# Patient Record
Sex: Female | Born: 1985
Health system: Southern US, Community
[De-identification: ages and names within clinical notes are randomized; demographics above are authoritative.]

## PROBLEM LIST (undated history)

## (undated) ENCOUNTER — Inpatient Hospital Stay (HOSPITAL_COMMUNITY): Payer: Self-pay

## (undated) DIAGNOSIS — Z01419 Encounter for gynecological examination (general) (routine) without abnormal findings: Secondary | ICD-10-CM

## (undated) DIAGNOSIS — N200 Calculus of kidney: Secondary | ICD-10-CM

## (undated) DIAGNOSIS — K219 Gastro-esophageal reflux disease without esophagitis: Secondary | ICD-10-CM

## (undated) DIAGNOSIS — Z975 Presence of (intrauterine) contraceptive device: Secondary | ICD-10-CM

## (undated) DIAGNOSIS — K5909 Other constipation: Secondary | ICD-10-CM

## (undated) DIAGNOSIS — Z87442 Personal history of urinary calculi: Secondary | ICD-10-CM

## (undated) DIAGNOSIS — G4700 Insomnia, unspecified: Secondary | ICD-10-CM

## (undated) DIAGNOSIS — Z8619 Personal history of other infectious and parasitic diseases: Secondary | ICD-10-CM

## (undated) HISTORY — DX: Gastro-esophageal reflux disease without esophagitis: K21.9

## (undated) HISTORY — DX: Encounter for gynecological examination (general) (routine) without abnormal findings: Z01.419

## (undated) HISTORY — DX: Other constipation: K59.09

## (undated) HISTORY — PX: BREAST SURGERY: SHX581

## (undated) HISTORY — DX: Personal history of other infectious and parasitic diseases: Z86.19

## (undated) HISTORY — DX: Personal history of urinary calculi: Z87.442

## (undated) HISTORY — DX: Presence of (intrauterine) contraceptive device: Z97.5

## (undated) HISTORY — PX: DILATION AND CURETTAGE OF UTERUS: SHX78

## (undated) HISTORY — DX: Insomnia, unspecified: G47.00

---
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
## 2010-11-27 LAB — GC/CHLAMYDIA PROBE AMP, GENITAL: Gonorrhea: NEGATIVE

## 2010-11-27 LAB — ABO/RH

## 2010-11-27 LAB — RPR: RPR: NONREACTIVE

## 2010-11-27 LAB — RUBELLA ANTIBODY, IGM: Rubella: IMMUNE

## 2010-11-27 LAB — HEPATITIS B SURFACE ANTIGEN: Hepatitis B Surface Ag: NEGATIVE

## 2011-03-05 ENCOUNTER — Other Ambulatory Visit (HOSPITAL_COMMUNITY): Payer: Self-pay | Admitting: Obstetrics and Gynecology

## 2011-03-05 DIAGNOSIS — Z0489 Encounter for examination and observation for other specified reasons: Secondary | ICD-10-CM

## 2011-03-26 ENCOUNTER — Ambulatory Visit (HOSPITAL_COMMUNITY)
Admission: RE | Admit: 2011-03-26 | Discharge: 2011-03-26 | Disposition: A | Discharge status: Home / Self Care | Payer: Medicaid Other | Source: Ambulatory Visit | Attending: Obstetrics and Gynecology | Admitting: Obstetrics and Gynecology

## 2011-03-26 ENCOUNTER — Encounter (HOSPITAL_COMMUNITY): Payer: Self-pay

## 2011-03-26 DIAGNOSIS — Z363 Encounter for antenatal screening for malformations: Secondary | ICD-10-CM | POA: Insufficient documentation

## 2011-03-26 DIAGNOSIS — O358XX0 Maternal care for other (suspected) fetal abnormality and damage, not applicable or unspecified: Secondary | ICD-10-CM | POA: Insufficient documentation

## 2011-03-26 DIAGNOSIS — Z1389 Encounter for screening for other disorder: Secondary | ICD-10-CM | POA: Insufficient documentation

## 2011-03-26 DIAGNOSIS — Z0489 Encounter for examination and observation for other specified reasons: Secondary | ICD-10-CM

## 2011-04-20 ENCOUNTER — Encounter (HOSPITAL_COMMUNITY): Payer: Self-pay | Admitting: *Deleted

## 2011-04-20 ENCOUNTER — Inpatient Hospital Stay (HOSPITAL_COMMUNITY)
Admission: AD | Admit: 2011-04-20 | Discharge: 2011-04-20 | Disposition: A | Discharge status: Home / Self Care | Payer: Medicaid Other | Source: Ambulatory Visit | Attending: Obstetrics and Gynecology | Admitting: Obstetrics and Gynecology

## 2011-04-20 DIAGNOSIS — O479 False labor, unspecified: Secondary | ICD-10-CM

## 2011-04-20 DIAGNOSIS — O4700 False labor before 37 completed weeks of gestation, unspecified trimester: Secondary | ICD-10-CM

## 2011-04-20 LAB — WET PREP, GENITAL

## 2011-04-20 LAB — URINE MICROSCOPIC-ADD ON

## 2011-04-20 LAB — URINALYSIS, ROUTINE W REFLEX MICROSCOPIC
Bilirubin Urine: NEGATIVE
Glucose, UA: NEGATIVE mg/dL
Ketones, ur: NEGATIVE mg/dL
Leukocytes, UA: NEGATIVE
pH: 7.5 (ref 5.0–8.0)

## 2011-04-20 LAB — FETAL FIBRONECTIN: Fetal Fibronectin: POSITIVE — AB

## 2011-04-20 MED ORDER — ACETAMINOPHEN 500 MG PO TABS
1000.0000 mg | ORAL_TABLET | Freq: Once | ORAL | Status: AC
  Administered 2011-04-20: 1000 mg via ORAL
  Filled 2011-04-20: qty 2

## 2011-04-20 MED ORDER — OXYCODONE-ACETAMINOPHEN 5-325 MG PO TABS
2.0000 | ORAL_TABLET | Freq: Once | ORAL | Status: DC
  Filled 2011-04-20: qty 2

## 2011-04-20 NOTE — Progress Notes (Signed)
PT HAS HX PTL WITH FIRST BABY.    THIS IS FIRST  TIME ANY UC WITH THIS PREG.  SAYS STARTED HURTING AT 10PM- THEN AT 1030- DRANK WATER- UC  THEN Q4 MIN.  CALLED DR BOVARD- TOLD HER TO COME IN.  LAST SEX - ON WED.

## 2011-04-20 NOTE — ED Provider Notes (Signed)
History     No chief complaint on file.  HPI 25 y.o. G3P1011 at [redacted]w[redacted]d with contractions tonight about q 4 min, no bleeding or LOF, + fetal movement. Last intercourse 8 PM 12/19. H/O PTL throughout first pregnancy with term delivery.     Past Medical History  Diagnosis Date  . Preterm labor     Past Surgical History  Procedure Date  . Breast surgery     No family history on file.  History  Substance Use Topics  . Smoking status: Former Games developer  . Smokeless tobacco: Not on file  . Alcohol Use: No    Allergies: No Known Allergies  Prescriptions prior to admission  Medication Sig Dispense Refill  . pantoprazole (PROTONIX) 40 MG tablet Take 40 mg by mouth daily.        Marland Kitchen PRENATAL VITAMINS PO Take by mouth.          Review of Systems  Constitutional: Negative.   Respiratory: Negative.   Cardiovascular: Negative.   Gastrointestinal: Negative for nausea, vomiting, abdominal pain, diarrhea and constipation.  Genitourinary: Negative for dysuria, urgency, frequency, hematuria and flank pain.       Negative for vaginal bleeding, Positive contractions   Musculoskeletal: Negative.   Neurological: Negative.   Psychiatric/Behavioral: Negative.    Physical Exam   Blood pressure 123/67, pulse 90, temperature 99.4 F (37.4 C), temperature source Oral, resp. rate 18, height 5\' 5"  (1.651 m), weight 163 lb (73.936 kg), last menstrual period 09/23/2010.  Physical Exam  Nursing note and vitals reviewed. Constitutional: She is oriented to person, place, and time. She appears well-developed and well-nourished. No distress.  HENT:  Head: Normocephalic and atraumatic.  Cardiovascular: Normal rate.   Respiratory: Effort normal.  GI: Soft. Bowel sounds are normal. She exhibits no mass. There is no tenderness. There is no rebound and no guarding.  Genitourinary: There is no rash or lesion on the right labia. There is no rash or lesion on the left labia. Uterus is not tender. Enlarged:  Size c/w dates. Cervix exhibits no discharge and no friability. No tenderness or bleeding around the vagina. No vaginal discharge found.       SVE: ext os 1/int os closed/thick/high/posterior  Musculoskeletal: Normal range of motion.  Neurological: She is alert and oriented to person, place, and time.  Skin: Skin is warm and dry.  Psychiatric: She has a normal mood and affect.    MAU Course  Procedures  Results for orders placed during the hospital encounter of 04/20/11 (from the past 24 hour(s))  URINALYSIS, ROUTINE W REFLEX MICROSCOPIC     Status: Abnormal   Collection Time   04/20/11 12:05 AM      Component Value Range   Color, Urine YELLOW  YELLOW    APPearance CLEAR  CLEAR    Specific Gravity, Urine 1.020  1.005 - 1.030    pH 7.5  5.0 - 8.0    Glucose, UA NEGATIVE  NEGATIVE (mg/dL)   Hgb urine dipstick LARGE (*) NEGATIVE    Bilirubin Urine NEGATIVE  NEGATIVE    Ketones, ur NEGATIVE  NEGATIVE (mg/dL)   Protein, ur NEGATIVE  NEGATIVE (mg/dL)   Urobilinogen, UA 0.2  0.0 - 1.0 (mg/dL)   Nitrite NEGATIVE  NEGATIVE    Leukocytes, UA NEGATIVE  NEGATIVE   URINE MICROSCOPIC-ADD ON     Status: Normal   Collection Time   04/20/11 12:05 AM      Component Value Range   Squamous Epithelial / LPF  RARE  RARE    WBC, UA 0-2  <3 (WBC/hpf)   RBC / HPF 21-50  <3 (RBC/hpf)   Urine-Other MUCOUS PRESENT    FETAL FIBRONECTIN     Status: Abnormal   Collection Time   04/20/11 12:55 AM      Component Value Range   Fetal Fibronectin POSITIVE (*) NEGATIVE   WET PREP, GENITAL     Status: Abnormal   Collection Time   04/20/11 12:55 AM      Component Value Range   Yeast, Wet Prep NONE SEEN  NONE SEEN    Trich, Wet Prep NONE SEEN  NONE SEEN    Clue Cells, Wet Prep NONE SEEN  NONE SEEN    WBC, Wet Prep HPF POC MODERATE (*) NONE SEEN     No cervical change after greater than 1 hour. Contractions appear to have decreased in frequency and duration. Pt states they feel about the same. Observed for  another hour, irritability only on monitor, pt states she is feeling better and ready for d/c.   Assessment and Plan  25 y.o. G3P1011 at [redacted]w[redacted]d Threatened preterm labor - no signs of active labor D/C home, f/u as scheduled or sooner PRN  FRAZIER,NATALIE 04/20/2011, 2:17 AM

## 2011-05-01 ENCOUNTER — Inpatient Hospital Stay (HOSPITAL_COMMUNITY): Payer: Medicaid Other

## 2011-05-01 ENCOUNTER — Encounter (HOSPITAL_COMMUNITY): Payer: Self-pay | Admitting: *Deleted

## 2011-05-01 ENCOUNTER — Observation Stay (HOSPITAL_COMMUNITY)
Admission: AD | Admit: 2011-05-01 | Discharge: 2011-05-02 | Disposition: A | Discharge status: Home / Self Care | Payer: Medicaid Other | Source: Ambulatory Visit | Attending: Obstetrics and Gynecology | Admitting: Obstetrics and Gynecology

## 2011-05-01 DIAGNOSIS — R319 Hematuria, unspecified: Secondary | ICD-10-CM | POA: Insufficient documentation

## 2011-05-01 DIAGNOSIS — N200 Calculus of kidney: Secondary | ICD-10-CM

## 2011-05-01 DIAGNOSIS — O99891 Other specified diseases and conditions complicating pregnancy: Principal | ICD-10-CM | POA: Insufficient documentation

## 2011-05-01 DIAGNOSIS — R109 Unspecified abdominal pain: Secondary | ICD-10-CM | POA: Insufficient documentation

## 2011-05-01 DIAGNOSIS — M549 Dorsalgia, unspecified: Secondary | ICD-10-CM | POA: Insufficient documentation

## 2011-05-01 HISTORY — DX: Calculus of kidney: N20.0

## 2011-05-01 LAB — URINALYSIS, ROUTINE W REFLEX MICROSCOPIC
Bilirubin Urine: NEGATIVE
Nitrite: NEGATIVE
Specific Gravity, Urine: 1.015 (ref 1.005–1.030)
Urobilinogen, UA: 0.2 mg/dL (ref 0.0–1.0)

## 2011-05-01 LAB — URINE MICROSCOPIC-ADD ON

## 2011-05-01 MED ORDER — DOCUSATE SODIUM 100 MG PO CAPS
100.0000 mg | ORAL_CAPSULE | Freq: Every day | ORAL | Status: DC
  Administered 2011-05-01 – 2011-05-02 (×2): 100 mg via ORAL
  Filled 2011-05-01 (×2): qty 1

## 2011-05-01 MED ORDER — ONDANSETRON HCL 4 MG/2ML IJ SOLN: 4.0000 mg | Freq: Once | INTRAMUSCULAR | Status: DC

## 2011-05-01 MED ORDER — HYDROCODONE-ACETAMINOPHEN 5-325 MG PO TABS
1.0000 | ORAL_TABLET | Freq: Once | ORAL | Status: AC
  Administered 2011-05-01: 1 via ORAL
  Filled 2011-05-01: qty 1

## 2011-05-01 MED ORDER — HYDROMORPHONE HCL PF 1 MG/ML IJ SOLN
1.0000 mg | Freq: Once | INTRAMUSCULAR | Status: DC
  Filled 2011-05-01: qty 1

## 2011-05-01 MED ORDER — CALCIUM CARBONATE ANTACID 500 MG PO CHEW: 2.0000 | CHEWABLE_TABLET | ORAL | Status: DC | PRN

## 2011-05-01 MED ORDER — PANTOPRAZOLE SODIUM 40 MG PO TBEC
40.0000 mg | DELAYED_RELEASE_TABLET | Freq: Every day | ORAL | Status: DC
  Administered 2011-05-01: 40 mg via ORAL
  Filled 2011-05-01 (×2): qty 1

## 2011-05-01 MED ORDER — PRENATAL MULTIVITAMIN CH: 1.0000 | ORAL_TABLET | Freq: Every day | ORAL | Status: DC

## 2011-05-01 MED ORDER — HYDROMORPHONE HCL PF 1 MG/ML IJ SOLN
1.0000 mg | Freq: Once | INTRAMUSCULAR | Status: AC
  Administered 2011-05-01: 1 mg via INTRAVENOUS

## 2011-05-01 MED ORDER — ACETAMINOPHEN 325 MG PO TABS: 650.0000 mg | ORAL_TABLET | ORAL | Status: DC | PRN

## 2011-05-01 MED ORDER — LACTATED RINGERS IV SOLN
INTRAVENOUS | Status: DC
  Administered 2011-05-01: 125 mL/h via INTRAVENOUS
  Administered 2011-05-02 (×2): via INTRAVENOUS

## 2011-05-01 MED ORDER — ZOLPIDEM TARTRATE 10 MG PO TABS: 10.0000 mg | ORAL_TABLET | Freq: Every evening | ORAL | Status: DC | PRN

## 2011-05-01 MED ORDER — PRENATAL MULTIVITAMIN CH
1.0000 | ORAL_TABLET | Freq: Every day | ORAL | Status: DC
  Administered 2011-05-01 – 2011-05-02 (×2): 1 via ORAL
  Filled 2011-05-01 (×2): qty 1

## 2011-05-01 MED ORDER — OXYCODONE-ACETAMINOPHEN 5-325 MG PO TABS
1.0000 | ORAL_TABLET | Freq: Four times a day (QID) | ORAL | Status: DC | PRN
  Administered 2011-05-01 – 2011-05-02 (×2): 2 via ORAL
  Filled 2011-05-01 (×2): qty 2

## 2011-05-01 MED ORDER — ONDANSETRON HCL 4 MG/2ML IJ SOLN
4.0000 mg | Freq: Once | INTRAMUSCULAR | Status: AC
  Administered 2011-05-01: 4 mg via INTRAVENOUS
  Filled 2011-05-01: qty 2

## 2011-05-01 MED ORDER — SODIUM CHLORIDE 0.9 % IV SOLN
INTRAVENOUS | Status: DC
  Administered 2011-05-01: 14:00:00 via INTRAVENOUS

## 2011-05-01 MED ORDER — OXYCODONE HCL 5 MG PO TABS
10.0000 mg | ORAL_TABLET | Freq: Once | ORAL | Status: AC
  Administered 2011-05-01: 10 mg via ORAL
  Filled 2011-05-01: qty 2

## 2011-05-01 NOTE — ED Provider Notes (Signed)
History     CSN: [id number]  Arrival date & time 05/01/11  1103   None     Chief Complaint  Patient presents with  . Back Pain  . Abdominal Cramping    HPI Barbara Wolfe is a 26 y.o. female @ [redacted]w[redacted]d gestation who presents to MAU for right flank pain that started this morning around 9:30. Now has nausea and vomiting. Felt like having contractions. Hx of preterm contractions. Also states feeling baby move but not as much as usual. The history was provided by the patient.  Past Medical History  Diagnosis Date  . Preterm labor     Past Surgical History  Procedure Date  . Breast surgery     No family history on file.  History  Substance Use Topics  . Smoking status: Former Games developer  . Smokeless tobacco: Not on file  . Alcohol Use: No    OB History    Grav Para Term Preterm Abortions TAB SAB Ect Mult Living   3 1 1  0 1 1 0 0 0 1      Review of Systems  Constitutional: Positive for chills. Negative for fever.  HENT: Negative.   Eyes: Negative.   Respiratory: Negative.   Cardiovascular: Negative.   Gastrointestinal: Positive for nausea, vomiting and abdominal pain.  Genitourinary: Negative for dysuria, vaginal bleeding and vaginal discharge.  Neurological: Negative for dizziness and headaches.  Psychiatric/Behavioral: Negative for confusion and agitation.    Allergies  Review of patient's allergies indicates no known allergies.  Home Medications  No current outpatient prescriptions on file.  BP 105/59  Pulse 93  Temp(Src) 98.8 F (37.1 C) (Oral)  Resp 18  Ht 5\' 5"  (1.651 m)  Wt 166 lb 3.2 oz (75.388 kg)  BMI 27.66 kg/m2  LMP 09/23/2010  Physical Exam  Nursing note and vitals reviewed. Constitutional: She is oriented to person, place, and time. She appears well-developed and well-nourished. No distress.       Uncomfortable appearing   HENT:  Head: Normocephalic.  Eyes: EOM are normal.  Neck: Neck supple.  Cardiovascular: Normal rate.     Pulmonary/Chest: Effort normal.  Abdominal: Soft. There is no tenderness.  Genitourinary:       Right flank pain on palpation  Musculoskeletal: Normal range of motion.  Neurological: She is alert and oriented to person, place, and time. No cranial nerve deficit.  Skin: Skin is warm and dry.  Psychiatric: She has a normal mood and affect. Her behavior is normal. Judgment and thought content normal.   EFM: Reactive tracing and no contractions   Results for orders placed during the hospital encounter of 05/01/11 (from the past 24 hour(s))  URINALYSIS, ROUTINE W REFLEX MICROSCOPIC     Status: Abnormal   Collection Time   05/01/11 11:11 AM      Component Value Range   Color, Urine YELLOW  YELLOW    APPearance CLOUDY (*) CLEAR    Specific Gravity, Urine 1.015  1.005 - 1.030    pH 8.0  5.0 - 8.0    Glucose, UA NEGATIVE  NEGATIVE (mg/dL)   Hgb urine dipstick MODERATE (*) NEGATIVE    Bilirubin Urine NEGATIVE  NEGATIVE    Ketones, ur NEGATIVE  NEGATIVE (mg/dL)   Protein, ur NEGATIVE  NEGATIVE (mg/dL)   Urobilinogen, UA 0.2  0.0 - 1.0 (mg/dL)   Nitrite NEGATIVE  NEGATIVE    Leukocytes, UA TRACE (*) NEGATIVE   URINE MICROSCOPIC-ADD ON     Status: Abnormal  Collection Time   05/01/11 11:11 AM      Component Value Range   Squamous Epithelial / LPF MANY (*) RARE    WBC, UA 3-6  <3 (WBC/hpf)   RBC / HPF 11-20  <3 (RBC/hpf)   Urine-Other AMORPHOUS URATES/PHOSPHATES    US Renal  05/01/2011  *RADIOLOGY REPORT*  Clinical Data: Back pain.  [redacted] weeks pregnant.  RENAL/URINARY TRACT ULTRASOUND COMPLETE  Comparison:  None.  Findings:  Right Kidney:  There is moderate dilatation of the right renal pelvis, calyces and proximal ureter.  No calculus is identified. Renal length is 13.1 cm.  There are no focal cortical abnormalities.  Left Kidney:  No hydronephrosis.  Well-preserved cortex.  Normal size and parenchymal echotexture without focal abnormalities. Renal length 12.6 cm.  Bladder:  Nearly empty and  suboptimally visualized.  Gravid uterus was not specifically evaluated.  IMPRESSION: Moderate right-sided hydronephrosis is greater than typically seen with pregnancy.  Correlate clinically.  If there is clinical concern of a ureteral calculus, further evaluation with CT or MRI should be considered.  Normal-appearing left kidney.  Original Report Authenticated By: Gerrianne Scale, M.D.   ED Course: Dr. Ellyn Hack notified of patient and we will treat pain and order renal  Ultrasound.  Procedures  MDM: Dr. Ellyn Hack notified of ultrasound results and will admit patient for 24 hour observation and pain management.    Assessment: Hematuria, probably kidney stone  Plan:  Observation   IV hydration   Pain management   Management of nausea and vomiting.        [place], [place] 05/01/11 2006

## 2011-05-01 NOTE — Progress Notes (Signed)
Patient states she had sudden onset of right flank pain this am, has been having contractions since 0930. No leaking or bleeding and reports fetal movement, not as much as usual.

## 2011-05-01 NOTE — L&D Delivery Note (Signed)
Delivery Note At 10:32 PM a viable and healthy female was delivered via Vaginal, Spontaneous Delivery (Presentation: Middle Occiput Anterior).  APGAR: 8, 9; weight 7 lbs 11 oz .   Placenta status: Intact, Spontaneous.  Cord: 3 vessels with the following complications: None.  Nuchal cord x 3 reduced.  Anesthesia: Epidural  Episiotomy: None Lacerations: Bilateral Labial Suture Repair: none Est. Blood Loss (mL): 350  Mom to postpartum.  Baby to nursery-stable.  Barbara Wolfe D 06/19/2011, 10:53 PM

## 2011-05-02 ENCOUNTER — Other Ambulatory Visit: Payer: Self-pay | Admitting: Obstetrics and Gynecology

## 2011-05-02 MED ORDER — OXYCODONE-ACETAMINOPHEN 5-325 MG PO TABS: 1.0000 | ORAL_TABLET | Freq: Four times a day (QID) | ORAL | Status: AC | PRN

## 2011-05-02 NOTE — Discharge Summary (Signed)
Obstetric Discharge Summary Reason for Admission: kidney stone, pain management Prenatal Procedures: NST and pain control Intrapartum Procedures: N/A Postpartum Procedures: N/A Complications-Operative and Postpartum: passed stone, sent to pathology No results found for this basename: hgb, hct    Discharge Diagnoses: IUP @31 +, kidney stone  Discharge Information: Date: 05/02/2011 Activity: unrestricted Diet: routine Medications: PNV and Percocet Condition: stable Instructions: refer to practice specific booklet and follow up with urology as needed.  Dr. Annabell Howells [facility] Discharge to: home Follow-up Information    Follow up with Bing Plume, MD. (as scheduled)    Contact information:   [facility] [address] [place], [address] [place] [address] [phone]            Wolfe,Barbara Jahn 05/02/2011, 9:03 AMob

## 2011-05-02 NOTE — Progress Notes (Signed)
Isella Slatten is a 26 y.o. G3P1011 at [redacted]w[redacted]d  admitted for kidney stone/pain management  Subjective: No c/o's.  Passed 2 stones overnight.  Pain improved.  +FM, no LOF, no VB, occ ctx  Objective: BP 91/40  Pulse 93  Temp(Src) 98.7 F (37.1 C) (Oral)  Resp 18  Ht 5\' 5"  (1.651 m)  Wt 75.388 kg (166 lb 3.2 oz)  BMI 27.66 kg/m2  LMP 09/23/2010     gen NAD Abd FNT  ZOX:[id number] NSTt UC:   infrequent  Labs: No results found for this basename: WBC, HGB, HCT, MCV, PLT    Assessment / Plan: 25yo G3P1011 at 31+ with likely kidney stone, passed overnight (x2) Pain improved, d/c home f/u in office as scheduled Follow up with urology as needed  BOVARD,Kendell Gammon 05/02/2011, 8:56 AM

## 2011-05-04 ENCOUNTER — Telehealth (HOSPITAL_COMMUNITY): Payer: Self-pay | Admitting: General Practice

## 2011-05-06 LAB — STONE ANALYSIS: Stone Weight KSTONE: 0.001 g

## 2011-05-23 NOTE — Progress Notes (Signed)
UR chart review completed.  

## 2011-06-03 ENCOUNTER — Inpatient Hospital Stay (HOSPITAL_COMMUNITY)
Admission: AD | Admit: 2011-06-03 | Discharge: 2011-06-03 | Disposition: A | Discharge status: Home / Self Care | Payer: Medicaid Other | Source: Ambulatory Visit | Attending: Obstetrics and Gynecology | Admitting: Obstetrics and Gynecology

## 2011-06-03 ENCOUNTER — Encounter (HOSPITAL_COMMUNITY): Payer: Self-pay | Admitting: *Deleted

## 2011-06-03 DIAGNOSIS — O4700 False labor before 37 completed weeks of gestation, unspecified trimester: Secondary | ICD-10-CM | POA: Insufficient documentation

## 2011-06-03 HISTORY — DX: Calculus of kidney: N20.0

## 2011-06-03 NOTE — Progress Notes (Signed)
States has had ctxs for about a wk. Usually get less intense and able to sleep some. Tonight  ctxs kept coming and alittle stronger.

## 2011-06-03 NOTE — Progress Notes (Signed)
Dr Jackelyn Knife notified of patient and her complaints, tracing, ctx pattern, sve result. Order to have patient ambulate and recheck cervix in an hour. If no change, discharge patient home with labor precautions.

## 2011-06-03 NOTE — Progress Notes (Signed)
Pt states, " I started having contractions at 9:30 pm and they became regular at 10:30pm. They are now every five minutes and are getting stronger."

## 2011-06-04 LAB — STREP B DNA PROBE: GBS: POSITIVE

## 2011-06-18 ENCOUNTER — Telehealth (HOSPITAL_COMMUNITY): Payer: Self-pay | Admitting: *Deleted

## 2011-06-18 ENCOUNTER — Encounter (HOSPITAL_COMMUNITY): Payer: Self-pay | Admitting: *Deleted

## 2011-06-18 NOTE — Telephone Encounter (Signed)
Preadmission screen  

## 2011-06-19 ENCOUNTER — Encounter (HOSPITAL_COMMUNITY): Payer: Self-pay | Admitting: Anesthesiology

## 2011-06-19 ENCOUNTER — Inpatient Hospital Stay (HOSPITAL_COMMUNITY): Payer: Medicaid Other | Admitting: Anesthesiology

## 2011-06-19 ENCOUNTER — Inpatient Hospital Stay (HOSPITAL_COMMUNITY)
Admission: AD | Admit: 2011-06-19 | Discharge: 2011-06-21 | DRG: 775 | Disposition: A | Discharge status: Home Health | Payer: Medicaid Other | Source: Ambulatory Visit | Attending: Obstetrics and Gynecology | Admitting: Obstetrics and Gynecology

## 2011-06-19 ENCOUNTER — Encounter (HOSPITAL_COMMUNITY): Payer: Self-pay | Admitting: *Deleted

## 2011-06-19 DIAGNOSIS — IMO0001 Reserved for inherently not codable concepts without codable children: Secondary | ICD-10-CM

## 2011-06-19 DIAGNOSIS — O99892 Other specified diseases and conditions complicating childbirth: Secondary | ICD-10-CM | POA: Diagnosis present

## 2011-06-19 DIAGNOSIS — Z22330 Carrier of Group B streptococcus: Secondary | ICD-10-CM

## 2011-06-19 DIAGNOSIS — N200 Calculus of kidney: Secondary | ICD-10-CM | POA: Diagnosis present

## 2011-06-19 LAB — CBC
HCT: 39 % (ref 36.0–46.0)
Hemoglobin: 13.1 g/dL (ref 12.0–15.0)
MCHC: 33.6 g/dL (ref 30.0–36.0)

## 2011-06-19 MED ORDER — SODIUM CHLORIDE 0.9 % IV SOLN
2.0000 g | Freq: Once | INTRAVENOUS | Status: AC
  Administered 2011-06-19: 2 g via INTRAVENOUS
  Filled 2011-06-19: qty 2000

## 2011-06-19 MED ORDER — OXYCODONE-ACETAMINOPHEN 5-325 MG PO TABS: 1.0000 | ORAL_TABLET | ORAL | Status: DC | PRN

## 2011-06-19 MED ORDER — FENTANYL 2.5 MCG/ML BUPIVACAINE 1/10 % EPIDURAL INFUSION (WH - ANES)
14.0000 mL/h | INTRAMUSCULAR | Status: DC
Start: 2011-06-19 — End: 2011-06-20
  Administered 2011-06-19: 14 mL/h via EPIDURAL
  Filled 2011-06-19: qty 60

## 2011-06-19 MED ORDER — LACTATED RINGERS IV SOLN
INTRAVENOUS | Status: DC
  Administered 2011-06-19: 20:00:00 via INTRAVENOUS
  Administered 2011-06-19: 125 mL/h via INTRAVENOUS
  Administered 2011-06-19: 21:00:00 via INTRAVENOUS

## 2011-06-19 MED ORDER — PENICILLIN G POTASSIUM 5000000 UNITS IJ SOLR
5.0000 10*6.[IU] | Freq: Once | INTRAMUSCULAR | Status: DC
  Administered 2011-06-19: 5 10*6.[IU] via INTRAVENOUS
  Filled 2011-06-19: qty 5

## 2011-06-19 MED ORDER — EPHEDRINE 5 MG/ML INJ
10.0000 mg | INTRAVENOUS | Status: DC | PRN
  Filled 2011-06-19: qty 4

## 2011-06-19 MED ORDER — LIDOCAINE HCL (PF) 1 % IJ SOLN
30.0000 mL | INTRAMUSCULAR | Status: DC | PRN
  Filled 2011-06-19: qty 30

## 2011-06-19 MED ORDER — LACTATED RINGERS IV SOLN: 500.0000 mL | Freq: Once | INTRAVENOUS | Status: DC

## 2011-06-19 MED ORDER — OXYTOCIN 20 UNITS IN LACTATED RINGERS INFUSION - SIMPLE: 125.0000 mL/h | Freq: Once | INTRAVENOUS | Status: DC

## 2011-06-19 MED ORDER — LIDOCAINE HCL (PF) 1 % IJ SOLN
INTRAMUSCULAR | Status: DC | PRN
  Administered 2011-06-19 (×2): 5 mL

## 2011-06-19 MED ORDER — IBUPROFEN 600 MG PO TABS: 600.0000 mg | ORAL_TABLET | Freq: Four times a day (QID) | ORAL | Status: DC | PRN

## 2011-06-19 MED ORDER — DIPHENHYDRAMINE HCL 50 MG/ML IJ SOLN: 12.5000 mg | INTRAMUSCULAR | Status: DC | PRN

## 2011-06-19 MED ORDER — LACTATED RINGERS IV SOLN: 500.0000 mL | INTRAVENOUS | Status: DC | PRN

## 2011-06-19 MED ORDER — OXYTOCIN BOLUS FROM INFUSION
500.0000 mL | Freq: Once | INTRAVENOUS | Status: DC
  Filled 2011-06-19: qty 1000
  Filled 2011-06-19: qty 500

## 2011-06-19 MED ORDER — PHENYLEPHRINE 40 MCG/ML (10ML) SYRINGE FOR IV PUSH (FOR BLOOD PRESSURE SUPPORT): 80.0000 ug | PREFILLED_SYRINGE | INTRAVENOUS | Status: DC | PRN

## 2011-06-19 MED ORDER — ACETAMINOPHEN 325 MG PO TABS: 650.0000 mg | ORAL_TABLET | ORAL | Status: DC | PRN

## 2011-06-19 MED ORDER — ONDANSETRON HCL 4 MG/2ML IJ SOLN: 4.0000 mg | Freq: Four times a day (QID) | INTRAMUSCULAR | Status: DC | PRN

## 2011-06-19 MED ORDER — PHENYLEPHRINE 40 MCG/ML (10ML) SYRINGE FOR IV PUSH (FOR BLOOD PRESSURE SUPPORT)
80.0000 ug | PREFILLED_SYRINGE | INTRAVENOUS | Status: DC | PRN
  Filled 2011-06-19: qty 5

## 2011-06-19 MED ORDER — CITRIC ACID-SODIUM CITRATE 334-500 MG/5ML PO SOLN: 30.0000 mL | ORAL | Status: DC | PRN

## 2011-06-19 MED ORDER — PENICILLIN G POTASSIUM 5000000 UNITS IJ SOLR
2.5000 10*6.[IU] | INTRAVENOUS | Status: DC
  Filled 2011-06-19 (×2): qty 2.5

## 2011-06-19 MED ORDER — EPHEDRINE 5 MG/ML INJ: 10.0000 mg | INTRAVENOUS | Status: DC | PRN

## 2011-06-19 NOTE — Anesthesia Preprocedure Evaluation (Signed)
Anesthesia Evaluation  Patient identified by MRN, date of birth, ID band Patient awake    Reviewed: Allergy & Precautions, H&P , Patient's Chart, lab work & pertinent test results  Airway Mallampati: II TM Distance: >3 FB Neck ROM: full    Dental No notable dental hx.    Pulmonary neg pulmonary ROS,  clear to auscultation  Pulmonary exam normal       Cardiovascular neg cardio ROS regular Normal    Neuro/Psych Negative Neurological ROS  Negative Psych ROS   GI/Hepatic negative GI ROS, Neg liver ROS, GERD-  ,  Endo/Other  Negative Endocrine ROS  Renal/GU negative Renal ROS     Musculoskeletal   Abdominal   Peds  Hematology negative hematology ROS (+)   Anesthesia Other Findings   Reproductive/Obstetrics (+) Pregnancy                           Anesthesia Physical Anesthesia Plan  ASA: II  Anesthesia Plan: Epidural   Post-op Pain Management:    Induction:   Airway Management Planned:   Additional Equipment:   Intra-op Plan:   Post-operative Plan:   Informed Consent: I have reviewed the patients History and Physical, chart, labs and discussed the procedure including the risks, benefits and alternatives for the proposed anesthesia with the patient or authorized representative who has indicated his/her understanding and acceptance.     Plan Discussed with:   Anesthesia Plan Comments:         Anesthesia Quick Evaluation  

## 2011-06-19 NOTE — Progress Notes (Signed)
Family member came out to states pt's water had broken.  Clear fluid noted.  Called BS charge to request to bring pt as she is feeling more uncomfortable.  Ok to bring pt.

## 2011-06-19 NOTE — Progress Notes (Signed)
Dr. Jackelyn Knife notified of pt presenting for labor check. Notified of VE and ctx pattern with GBS + status.  MD will place orders.

## 2011-06-19 NOTE — Progress Notes (Signed)
Pt presents with contractions. Reports leaking fluid since 1730.

## 2011-06-19 NOTE — H&P (Signed)
Lana Flaim is a 26 y.o. female, G3 P1011, EGA 38+ weeks presenting for evaluation of regular ctx.  In MAU, reg ctx, VE 5 cm.  On arrival to L&D had SROM with clear fluid, just received epidural.  Prenatal care complicated by kidney stone and recent + chlamydia.  See prenatal records for complete history.  Maternal Medical History:  Reason for admission: Reason for admission: contractions.  Contractions: Frequency: regular.   Perceived severity is strong.    Fetal activity: Perceived fetal activity is normal.      OB History    Grav Para Term Preterm Abortions TAB SAB Ect Mult Living   3 1 1  0 1 1 0 0 0 1    SVD at 39 weeks, 7 lbs 11 oz  Past Medical History  Diagnosis Date  . Preterm labor   . Kidney stone 05/01/2011  . GERD (gastroesophageal reflux disease)    Past Surgical History  Procedure Date  . Dilation and curettage of uterus   . Breast surgery     augmentation   Family History: family history includes Cancer in her maternal grandfather.  There is no history of Anesthesia problems, and Hypotension, and Malignant hyperthermia, and Pseudochol deficiency, . Social History:  reports that she has never smoked. She has never used smokeless tobacco. She reports that she does not drink alcohol or use illicit drugs.  Review of Systems  Respiratory: Negative.   Cardiovascular: Negative.     Dilation: 8.5 Effacement (%): 90 Station: 0 Exam by:: Dr. Jackelyn Knife Blood pressure 136/76, pulse 78, temperature 99 F (37.2 C), resp. rate 20, height 5\' 6"  (1.676 m), weight 80.287 kg (177 lb), last menstrual period 09/23/2010, SpO2 100.00%. Maternal Exam:  Uterine Assessment: Contraction strength is firm.  Contraction frequency is regular.   Abdomen: Patient reports no abdominal tenderness. Estimated fetal weight is 7 1/2 lbs.   Fetal presentation: vertex  Introitus: Normal vulva. Normal vagina.  Amniotic fluid character: clear.  Pelvis: adequate for delivery.   Cervix: Cervix  evaluated by digital exam.     Fetal Exam Fetal Monitor Review: Mode: ultrasound.   Baseline rate: 130s.  Variability: minimal (<5 bpm).   Pattern: accelerations present and no decelerations.    Fetal State Assessment: Category I - tracings are normal.     Physical Exam  Constitutional: She appears well-developed and well-nourished.  Cardiovascular: Normal rate, regular rhythm and normal heart sounds.   No murmur heard. Respiratory: Effort normal and breath sounds normal. No respiratory distress. She has no wheezes.  GI: Soft.       Gravid     Prenatal labs: ABO, Rh: B/Positive/-- (07/30 0000) Antibody: Negative (07/30 0000) Rubella: Immune (07/30 0000) RPR: Nonreactive (07/30 0000)  HBsAg: Negative (07/30 0000)  HIV: Non-reactive (07/30 0000)  GBS: Positive (02/04 0000)   Assessment/Plan: IUP at 38+ weeks in active labor with SROM.  Has epidural, will monitor progress.  Had started PCN for +GBS, but switched to Ampicillin due to rapid progress.     Jceon Alverio D 06/19/2011, 9:30 PM

## 2011-06-19 NOTE — Progress Notes (Signed)
Birthing suite charge RN will call back when room available.

## 2011-06-19 NOTE — Progress Notes (Signed)
Pt may go to room 164 in twenty minutes.

## 2011-06-19 NOTE — Anesthesia Procedure Notes (Signed)
Epidural Patient location during procedure: OB Start time: 06/19/2011 9:12 PM  Staffing Anesthesiologist: Brayton Caves R Performed by: anesthesiologist   Preanesthetic Checklist Completed: patient identified, site marked, surgical consent, pre-op evaluation, timeout performed, IV checked, risks and benefits discussed and monitors and equipment checked  Epidural Patient position: sitting Prep: site prepped and draped and DuraPrep Patient monitoring: continuous pulse ox and blood pressure Approach: midline Injection technique: LOR air and LOR saline  Needle:  Needle type: Tuohy  Needle gauge: 17 G Needle length: 9 cm Needle insertion depth: 5 cm cm Catheter type: closed end flexible Catheter size: 19 Gauge Catheter at skin depth: 10 cm Test dose: negative  Assessment Events: blood not aspirated, injection not painful, no injection resistance, negative IV test and no paresthesia  Additional Notes Patient identified.  Risk benefits discussed including failed block, incomplete pain control, headache, nerve damage, paralysis, blood pressure changes, nausea, vomiting, reactions to medication both toxic or allergic, and postpartum back pain.  Patient expressed understanding and wished to proceed.  All questions were answered.  Sterile technique used throughout procedure and epidural site dressed with sterile barrier dressing. No paresthesia or other complications noted.The patient did not experience any signs of intravascular injection such as tinnitus or metallic taste in mouth nor signs of intrathecal spread such as rapid motor block. Please see nursing notes for vital signs.

## 2011-06-20 MED ORDER — DIBUCAINE 1 % RE OINT: 1.0000 "application " | TOPICAL_OINTMENT | RECTAL | Status: DC | PRN

## 2011-06-20 MED ORDER — LANOLIN HYDROUS EX OINT: TOPICAL_OINTMENT | CUTANEOUS | Status: DC | PRN

## 2011-06-20 MED ORDER — MEASLES, MUMPS & RUBELLA VAC ~~LOC~~ INJ
0.5000 mL | INJECTION | Freq: Once | SUBCUTANEOUS | Status: DC
  Filled 2011-06-20: qty 0.5

## 2011-06-20 MED ORDER — METHYLERGONOVINE MALEATE 0.2 MG/ML IJ SOLN: 0.2000 mg | INTRAMUSCULAR | Status: DC | PRN

## 2011-06-20 MED ORDER — TETANUS-DIPHTH-ACELL PERTUSSIS 5-2.5-18.5 LF-MCG/0.5 IM SUSP: 0.5000 mL | Freq: Once | INTRAMUSCULAR | Status: DC

## 2011-06-20 MED ORDER — BENZOCAINE-MENTHOL 20-0.5 % EX AERO: 1.0000 "application " | INHALATION_SPRAY | CUTANEOUS | Status: DC | PRN

## 2011-06-20 MED ORDER — IBUPROFEN 600 MG PO TABS
600.0000 mg | ORAL_TABLET | Freq: Four times a day (QID) | ORAL | Status: DC
  Administered 2011-06-20 – 2011-06-21 (×5): 600 mg via ORAL
  Filled 2011-06-20 (×5): qty 1

## 2011-06-20 MED ORDER — SIMETHICONE 80 MG PO CHEW: 80.0000 mg | CHEWABLE_TABLET | ORAL | Status: DC | PRN

## 2011-06-20 MED ORDER — OXYTOCIN 20 UNITS IN LACTATED RINGERS INFUSION - SIMPLE: 125.0000 mL/h | INTRAVENOUS | Status: DC | PRN

## 2011-06-20 MED ORDER — ONDANSETRON HCL 4 MG/2ML IJ SOLN: 4.0000 mg | INTRAMUSCULAR | Status: DC | PRN

## 2011-06-20 MED ORDER — DIPHENHYDRAMINE HCL 25 MG PO CAPS: 25.0000 mg | ORAL_CAPSULE | Freq: Four times a day (QID) | ORAL | Status: DC | PRN

## 2011-06-20 MED ORDER — SENNOSIDES-DOCUSATE SODIUM 8.6-50 MG PO TABS
2.0000 | ORAL_TABLET | Freq: Every day | ORAL | Status: DC
  Administered 2011-06-20: 2 via ORAL

## 2011-06-20 MED ORDER — ONDANSETRON HCL 4 MG PO TABS: 4.0000 mg | ORAL_TABLET | ORAL | Status: DC | PRN

## 2011-06-20 MED ORDER — PRENATAL MULTIVITAMIN CH
1.0000 | ORAL_TABLET | Freq: Every day | ORAL | Status: DC
  Administered 2011-06-20: 1 via ORAL
  Filled 2011-06-20: qty 1

## 2011-06-20 MED ORDER — METHYLERGONOVINE MALEATE 0.2 MG PO TABS: 0.2000 mg | ORAL_TABLET | ORAL | Status: DC | PRN

## 2011-06-20 MED ORDER — ZOLPIDEM TARTRATE 5 MG PO TABS: 5.0000 mg | ORAL_TABLET | Freq: Every evening | ORAL | Status: DC | PRN

## 2011-06-20 MED ORDER — WITCH HAZEL-GLYCERIN EX PADS: 1.0000 "application " | MEDICATED_PAD | CUTANEOUS | Status: DC | PRN

## 2011-06-20 MED ORDER — MAGNESIUM HYDROXIDE 400 MG/5ML PO SUSP: 30.0000 mL | ORAL | Status: DC | PRN

## 2011-06-20 MED ORDER — OXYCODONE-ACETAMINOPHEN 5-325 MG PO TABS
1.0000 | ORAL_TABLET | ORAL | Status: DC | PRN
  Administered 2011-06-20 (×3): 2 via ORAL
  Administered 2011-06-21: 1 via ORAL
  Filled 2011-06-20: qty 1
  Filled 2011-06-20 (×3): qty 2

## 2011-06-20 NOTE — Anesthesia Postprocedure Evaluation (Signed)
  Anesthesia Post-op Note  Patient: Barbara Wolfe  Procedure(s) Performed: * No procedures listed *  Patient Location: Mother/Baby  Anesthesia Type: Epidural  Level of Consciousness: awake, alert  and oriented  Airway and Oxygen Therapy: Patient Spontanous Breathing  Post-op Pain: none  Post-op Assessment: Patient's Cardiovascular Status Stable and Respiratory Function Stable  Post-op Vital Signs: stable  Complications: No apparent anesthesia complications

## 2011-06-20 NOTE — Progress Notes (Signed)
UR chart review completed.  

## 2011-06-20 NOTE — Progress Notes (Signed)
PPD #1 No problems Afeb, VSS Fundus firm, NT at U-1 Continue routine postpartum care 

## 2011-06-21 MED ORDER — OXYCODONE-ACETAMINOPHEN 5-325 MG PO TABS: 1.0000 | ORAL_TABLET | ORAL | Status: AC | PRN

## 2011-06-21 NOTE — Discharge Summary (Signed)
Obstetric Discharge Summary Reason for Admission: onset of labor Prenatal Procedures: none Intrapartum Procedures: spontaneous vaginal delivery Postpartum Procedures: none Complications-Operative and Postpartum: bilateral labial lacerations Hemoglobin  Date Value Range Status  06/19/2011 13.1  12.0-15.0 (g/dL) Final     HCT  Date Value Range Status  06/19/2011 39.0  36.0-46.0 (%) Final    Discharge Diagnoses: Term Pregnancy-delivered  Discharge Information: Date: 06/21/2011 Activity: pelvic rest Diet: routine Medications: Ibuprofen and Percocet Condition: stable Instructions: refer to practice specific booklet Discharge to: home Follow-up Information    Follow up with Tim Corriher D, MD. (call the office for an appointment in 6 wks)          Newborn Data: Live born female  Birth Weight: 7 lb 11 oz (3487 g) APGAR: 8, 9  Home with mother.  Aune Adami D 06/21/2011, 10:06 AM

## 2011-06-21 NOTE — Progress Notes (Signed)
PPD # 2 Afebrile, VSS Ambulating well  Nursing well Scant lochia Voiding without difficulty , no BM yet F/u in office in 6 wks

## 2011-06-21 NOTE — Discharge Instructions (Signed)
As per discharge pamphlet °

## 2011-06-26 ENCOUNTER — Inpatient Hospital Stay (HOSPITAL_COMMUNITY): Admission: RE | Admit: 2011-06-26 | Payer: Medicaid Other | Source: Ambulatory Visit

## 2011-06-29 ENCOUNTER — Inpatient Hospital Stay (HOSPITAL_COMMUNITY): Admission: RE | Admit: 2011-06-29 | Payer: Medicaid Other | Source: Ambulatory Visit

## 2012-02-19 ENCOUNTER — Ambulatory Visit (INDEPENDENT_AMBULATORY_CARE_PROVIDER_SITE_OTHER): Payer: 59 | Admitting: Medical

## 2012-02-19 ENCOUNTER — Ambulatory Visit: Payer: Self-pay | Admitting: Medical

## 2012-02-19 ENCOUNTER — Encounter: Payer: Self-pay | Admitting: Medical

## 2012-02-19 VITALS — BP 110/80 | HR 92 | Temp 98.4°F | Resp 18 | Wt 128.0 lb

## 2012-02-19 DIAGNOSIS — G4700 Insomnia, unspecified: Secondary | ICD-10-CM

## 2012-02-19 DIAGNOSIS — H6690 Otitis media, unspecified, unspecified ear: Secondary | ICD-10-CM

## 2012-02-19 DIAGNOSIS — J029 Acute pharyngitis, unspecified: Secondary | ICD-10-CM

## 2012-02-19 DIAGNOSIS — H6692 Otitis media, unspecified, left ear: Secondary | ICD-10-CM

## 2012-02-19 MED ORDER — TRAZODONE HCL 50 MG PO TABS: ORAL_TABLET | ORAL | Status: DC

## 2012-02-19 MED ORDER — AMOXICILLIN 875 MG PO TABS: 875.0000 mg | ORAL_TABLET | Freq: Two times a day (BID) | ORAL | Status: DC

## 2012-02-19 NOTE — Progress Notes (Signed)
Subjective: Here as a new patient today.    Moved from [place], [place] 2 years ago, was followed by obstetrics last year, but is here to establish primary care today.  She is here for illness.   She notes that she has been dealing with left ear infection for the last 4-5 weeks.  Pain, sinus pressure, sore throat, left ear pain ongoing, fever up to 100.2, some cough, but now worsening ear pain.  No sick contacts.  She has her 57mo son here today but he is not sick or in day care.   Using nothing for her symptoms.  She has second c/o insomnia.  C/o insomnia since age 26yo.   Put on Lunesta at age 26yo.   Has trouble falling asleep.  Once she gets to sleep can normally stay asleep.  Had used Lunesta on and off in the past.   Has failed OTC remedies such as melatonin, benadryl.  She is currently using Unisom daily for months.   She denies stress/ anxiety, exercises 5 days per week, no oral intake within 2 hours of bedtime, no tv or reading in bed, no loud distractions in the house at bedtime, no c/o snoring or sleep apnea, no daytime somnolence, and is rested in the mornings.  Just has always had trouble getting to sleep.    Past Medical History  Diagnosis Date  . Preterm labor   . Kidney stone 05/01/2011  . GERD (gastroesophageal reflux disease)    ROS as in HPI    Objective:   Physical Exam  Filed Vitals:   02/19/12 1124  BP: 110/80  Pulse: 92  Temp: 98.4 F (36.9 C)  Resp: 18    General appearance: alert, no distress, WD/WN HEENT: normocephalic, sclerae anicteric, left TM with retraction, erythema throughout, right TM normal appearing, nares with swollen erythematous turbinates, no discharge, pharynx with generalized erythema, tonsils 1-2+ swollen Oral cavity: MMM, no lesions Neck: supple, no lymphadenopathy, no thyromegaly, no masses Heart: RRR, normal S1, S2, no murmurs Lungs: CTA bilaterally, no wheezes, rhonchi, or rales  Assessment and Plan :    Encounter Diagnoses  Name Primary?    . Otitis media of left ear Yes  . Pharyngitis   . Insomnia    Otitis and pharyngitis - begin amoxicillin, rest, hydrate well, consider OTC decongestant.  If not improving, call or return.  Insomnia - discussed sleep hygiene, advised she c/t exercise, discussed strategies to deal with insomnia, and begin trial of trazodone QHS instead of continuing Unisom.  F/u in 25mo.

## 2012-03-10 ENCOUNTER — Telehealth: Payer: Self-pay | Admitting: Medical

## 2012-03-10 NOTE — Telephone Encounter (Signed)
Needs refill sleep meds, had to take 2 now out needs refill   Please call patient, when refilled   [facility] [facility] [address]

## 2012-03-11 ENCOUNTER — Other Ambulatory Visit: Payer: Self-pay | Admitting: Medical

## 2012-03-11 MED ORDER — TRAZODONE HCL 50 MG PO TABS: ORAL_TABLET | ORAL | Status: DC

## 2012-03-11 NOTE — Telephone Encounter (Signed)
Refill sent for 1-2 tablets QHS.  Note said she was taking 1-2 QHS.   Recheck OV within a month regarding sleep

## 2012-03-11 NOTE — Telephone Encounter (Signed)
I LEFT MESSAGE ON THE MACHINE IN REGARDS TO HER MEDICATION AND HER FOLLOW UP VISIT. CLS

## 2012-04-02 ENCOUNTER — Telehealth: Payer: Self-pay | Admitting: Medical

## 2012-04-03 ENCOUNTER — Other Ambulatory Visit: Payer: Self-pay | Admitting: Medical

## 2012-04-03 MED ORDER — TRAZODONE HCL 50 MG PO TABS: ORAL_TABLET | ORAL | Status: DC

## 2012-04-03 NOTE — Telephone Encounter (Signed)
Barbara Wolfe - This was suppose to be f/u appt.  I did send the medication.   See how the trazadone is working?  Are her ears better?  If she hasn't had a physical in over a year, next visit can be a CPX and recheck on insomnia.  F/u 1-67mo

## 2012-04-03 NOTE — Telephone Encounter (Signed)
PATIENT STATES THAT THE MEDICATION IS WORKING AND HER EARS ARE BETTER. CLS   PATIENT IS AWARE THAT SHE WILL NEED A FOLLOW UP APPOINTMENT FOR THE INSOMNIA AND A PHYSICAL APPOINTMENT. CLS

## 2012-04-24 ENCOUNTER — Emergency Department (HOSPITAL_COMMUNITY): Payer: 59

## 2012-04-24 ENCOUNTER — Emergency Department (HOSPITAL_COMMUNITY)
Admission: EM | Admit: 2012-04-24 | Discharge: 2012-04-24 | Disposition: A | Discharge status: Home / Self Care | Payer: 59 | Attending: Emergency Medicine | Admitting: Emergency Medicine

## 2012-04-24 ENCOUNTER — Encounter (HOSPITAL_COMMUNITY): Payer: Self-pay | Admitting: *Deleted

## 2012-04-24 DIAGNOSIS — Z87442 Personal history of urinary calculi: Secondary | ICD-10-CM | POA: Insufficient documentation

## 2012-04-24 DIAGNOSIS — Z8742 Personal history of other diseases of the female genital tract: Secondary | ICD-10-CM | POA: Insufficient documentation

## 2012-04-24 DIAGNOSIS — R110 Nausea: Secondary | ICD-10-CM | POA: Insufficient documentation

## 2012-04-24 DIAGNOSIS — Z3202 Encounter for pregnancy test, result negative: Secondary | ICD-10-CM | POA: Insufficient documentation

## 2012-04-24 DIAGNOSIS — Z8719 Personal history of other diseases of the digestive system: Secondary | ICD-10-CM | POA: Insufficient documentation

## 2012-04-24 DIAGNOSIS — Z79899 Other long term (current) drug therapy: Secondary | ICD-10-CM | POA: Insufficient documentation

## 2012-04-24 DIAGNOSIS — R109 Unspecified abdominal pain: Secondary | ICD-10-CM

## 2012-04-24 LAB — URINALYSIS, ROUTINE W REFLEX MICROSCOPIC
Bilirubin Urine: NEGATIVE
Glucose, UA: NEGATIVE mg/dL
Hgb urine dipstick: NEGATIVE
Ketones, ur: NEGATIVE mg/dL
Specific Gravity, Urine: 1.026 (ref 1.005–1.030)
pH: 5.5 (ref 5.0–8.0)

## 2012-04-24 NOTE — ED Provider Notes (Signed)
History     CSN: [id number]  Arrival date & time 04/24/12  7829   First MD Initiated Contact with Patient 04/24/12 1012      Chief Complaint  Patient presents with  . Constipation    (Consider location/radiation/quality/duration/timing/severity/associated sxs/prior treatment) HPI 26 year old female presents to the emergency department with chief complaint of constipation.  She states that she has been unable to make a full bowel movement for the past 2 weeks.  She's only been able to make very small amounts of hard stool.  She has struggled with intermittent bouts of constipation her whole life.  She came in today because she was having some associated abdominal pain and nausea.  She has tried laxatives, as stool softeners, glycerin suppository and enema without relief of symptoms.  Patient does not use opiates.  She runs daily.  She has a generally healthy diet and drinks plenty of water. She does not take Iron.  Denies fevers, chills, myalgias, arthralgias. Denies DOE, SOB, chest tightness or pressure, radiation to left arm, jaw or back, or diaphoresis. Denies dysuria, flank pain, suprapubic pain, frequency, urgency, or hematuria. Denies headaches, light headedness, weakness, visual disturbances.     Past Medical History  Diagnosis Date  . Preterm labor   . Kidney stone 05/01/2011  . GERD (gastroesophageal reflux disease)     Past Surgical History  Procedure Date  . Dilation and curettage of uterus   . Breast surgery     augmentation    Family History  Problem Relation Age of Onset  . Anesthesia problems Neg Hx   . Hypotension Neg Hx   . Malignant hyperthermia Neg Hx   . Pseudochol deficiency Neg Hx   . Cancer Maternal Grandfather     colon    History  Substance Use Topics  . Smoking status: Never Smoker   . Smokeless tobacco: Never Used  . Alcohol Use: No    OB History    Grav Para Term Preterm Abortions TAB SAB Ect Mult Living   3 2 2  0 1 1 0 0 0 2       Review of Systems Ten systems reviewed and are negative for acute change, except as noted in the HPI.   Allergies  Review of patient's allergies indicates no known allergies.  Home Medications   Current Outpatient Rx  Name  Route  Sig  Dispense  Refill  . TRAZODONE HCL 50 MG PO TABS      1-2 tablet QHS   45 tablet   1     BP 114/67  Pulse 82  Temp 97.8 F (36.6 C) (Oral)  Resp 16  SpO2 100%  LMP 04/03/2012  Breastfeeding? No  Physical Exam Physical Exam  Nursing note and vitals reviewed. Constitutional: She is oriented to person, place, and time. She appears well-developed and well-nourished. No distress.  HENT:  Head: Normocephalic and atraumatic.  Eyes: Conjunctivae normal and EOM are normal. Pupils are equal, round, and reactive to light. No scleral icterus.  Neck: Normal range of motion.  Cardiovascular: Normal rate, regular rhythm and normal heart sounds.  Exam reveals no gallop and no friction rub.   No murmur heard. Pulmonary/Chest: Effort normal and breath sounds normal. No respiratory distress.  Abdominal: Soft. Bowel sounds are normal. She exhibits no distension and no mass. There is no tenderness. There is no guarding.  Neurological: She is alert and oriented to person, place, and time.  Skin: Skin is warm and dry. She is not diaphoretic.  ED Course  Procedures (including critical care time)  Labs Reviewed  URINALYSIS, ROUTINE W REFLEX MICROSCOPIC - Abnormal; Notable for the following:    APPearance CLOUDY (*)     All other components within normal limits  POCT PREGNANCY, URINE   Dg Abd Acute W/chest  04/24/2012  *RADIOLOGY REPORT*  Clinical Data: Abdominal pain and vomiting.  ACUTE ABDOMEN SERIES (ABDOMEN 2 VIEW & CHEST 1 VIEW)  Comparison: None.  Findings: Single view of the chest demonstrates clear lungs and normal heart size.  No pneumothorax or pleural fluid.  Two views of the abdomen show no free intraperitoneal air.  The bowel gas  pattern is normal.  No unexpected abdominal calcification or focal bony abnormality is identified.  IUD is in place.  IMPRESSION: No acute finding.   Original Report Authenticated By: Holley Dexter, M.D.      1. Abdominal discomfort       MDM  10:34 AM Patient with acute on chronic constipation.  Will get acute abd plain film.  Patientxray shows some stool in the ascending colon but the colon is otherwise empty. I have advised the patient that she should f/u with a GI spcialist regarding her chronic constipation. Discussed reasons to seek immediate care. Patient expresses understanding and agrees with plan.         Arthor Captain, PA-C 04/24/12 1639

## 2012-04-24 NOTE — ED Notes (Signed)
Fighting with constipation for months. Last bm x 2 weeks ago. Very sm. Stool. Stomach is cramping. Some nausea. Taking laxatives x 2 weeks with very min. Results. Take trazodone.

## 2012-04-24 NOTE — ED Notes (Signed)
Patient transported to X-ray 

## 2012-04-25 ENCOUNTER — Telehealth: Payer: Self-pay | Admitting: Family Medicine

## 2012-04-25 ENCOUNTER — Other Ambulatory Visit: Payer: Self-pay | Admitting: Medical

## 2012-04-25 MED ORDER — TRAZODONE HCL 50 MG PO TABS: 50.0000 mg | ORAL_TABLET | Freq: Every day | ORAL | Status: DC

## 2012-04-25 NOTE — Telephone Encounter (Signed)
Is she referring to the Trazodone?

## 2012-04-25 NOTE — Telephone Encounter (Signed)
I spoke with the patient and she schedule an appointment to have a physical on 05/07/12 and to discuss constipation. She want to know can you call in a few days of the sleeping medication that you gave her until she can come by for her physical. CLS

## 2012-04-25 NOTE — Telephone Encounter (Signed)
Message copied by Janeice Robinson on Fri Apr 25, 2012  1:01 PM ------      Message from: Jac Canavan      Created: Fri Apr 25, 2012  7:37 AM       Have her come see me regarding constipation.  She went to ED yesterday for this.

## 2012-04-25 NOTE — Telephone Encounter (Signed)
yes

## 2012-04-27 NOTE — ED Provider Notes (Signed)
Medical screening examination/treatment/procedure(s) were performed by non-physician practitioner and as supervising physician I was immediately available for consultation/collaboration.  Mahari Strahm R. Azyah Flett, MD 04/27/12 0854 

## 2012-05-07 ENCOUNTER — Encounter: Payer: Self-pay | Admitting: Medical

## 2012-05-07 ENCOUNTER — Ambulatory Visit (INDEPENDENT_AMBULATORY_CARE_PROVIDER_SITE_OTHER): Payer: 59 | Admitting: Medical

## 2012-05-07 VITALS — BP 100/70 | HR 82 | Temp 98.1°F | Resp 16 | Ht 66.0 in | Wt 122.0 lb

## 2012-05-07 DIAGNOSIS — K5900 Constipation, unspecified: Secondary | ICD-10-CM

## 2012-05-07 DIAGNOSIS — K219 Gastro-esophageal reflux disease without esophagitis: Secondary | ICD-10-CM | POA: Insufficient documentation

## 2012-05-07 DIAGNOSIS — Z23 Encounter for immunization: Secondary | ICD-10-CM

## 2012-05-07 DIAGNOSIS — K029 Dental caries, unspecified: Secondary | ICD-10-CM

## 2012-05-07 DIAGNOSIS — Z0000 Encounter for general adult medical examination without abnormal findings: Secondary | ICD-10-CM

## 2012-05-07 DIAGNOSIS — G4700 Insomnia, unspecified: Secondary | ICD-10-CM

## 2012-05-07 DIAGNOSIS — K5909 Other constipation: Secondary | ICD-10-CM | POA: Insufficient documentation

## 2012-05-07 LAB — COMPREHENSIVE METABOLIC PANEL
ALT: 11 U/L (ref 0–35)
Albumin: 4.7 g/dL (ref 3.5–5.2)
CO2: 28 mEq/L (ref 19–32)
Chloride: 104 mEq/L (ref 96–112)
Potassium: 4.4 mEq/L (ref 3.5–5.3)
Sodium: 138 mEq/L (ref 135–145)
Total Bilirubin: 0.9 mg/dL (ref 0.3–1.2)
Total Protein: 7 g/dL (ref 6.0–8.3)

## 2012-05-07 LAB — POCT URINALYSIS DIPSTICK
Bilirubin, UA: NEGATIVE
Glucose, UA: NEGATIVE
Ketones, UA: NEGATIVE
Leukocytes, UA: NEGATIVE
Protein, UA: NEGATIVE
Spec Grav, UA: 1.01

## 2012-05-07 LAB — CBC WITH DIFFERENTIAL/PLATELET
Basophils Absolute: 0 10*3/uL (ref 0.0–0.1)
Eosinophils Absolute: 0.1 10*3/uL (ref 0.0–0.7)
Eosinophils Relative: 1 % (ref 0–5)
Lymphocytes Relative: 35 % (ref 12–46)
MCV: 89.8 fL (ref 78.0–100.0)
Neutrophils Relative %: 52 % (ref 43–77)
Platelets: 315 10*3/uL (ref 150–400)
RDW: 13.4 % (ref 11.5–15.5)
WBC: 5.1 10*3/uL (ref 4.0–10.5)

## 2012-05-07 LAB — LIPID PANEL
Cholesterol: 140 mg/dL (ref 0–200)
LDL Cholesterol: 80 mg/dL (ref 0–99)
VLDL: 8 mg/dL (ref 0–40)

## 2012-05-07 MED ORDER — LUBIPROSTONE 8 MCG PO CAPS: 8.0000 ug | ORAL_CAPSULE | Freq: Two times a day (BID) | ORAL | Status: DC

## 2012-05-07 NOTE — Progress Notes (Signed)
Subjective:   HPI  Barbara Wolfe is a 27 y.o. female who presents for a complete physical.  Been doing well.  Exercises regularly, eats healthy.  Has longstanding few year problems of constipation.  No blood in stool.   No diarrhea.  Just seems to get constipated quite often.  Went to the emergency dept few weeks ago for same.   Was advised to use some OTC medication which helped.  She does report eating fiber and drinking plenty of water daily.  She also notes hx/o GERD longstanding.  Did well with Protonix while pregnant, but came off the medication after having the baby.  Food triggers don't seem to matter.  She thinks she was H pylori negative with testing in [place], [place] in the past.  Uses Protonix some.  Has some insomnia.  Uses the trazodone periodically.  Sees gynecology, up to date on pap, has IUD in place.     Past Medical History  Diagnosis Date  . Preterm labor   . Kidney stone 05/01/2011  . GERD (gastroesophageal reflux disease)   . Chronic constipation   . Routine gynecological examination     [facility] gynecology  . Insomnia   . IUD (intrauterine death)     mirena  . History of renal stone     Past Surgical History  Procedure Date  . Dilation and curettage of uterus   . Breast surgery     augmentation    Family History  Problem Relation Age of Onset  . Anesthesia problems Neg Hx   . Hypotension Neg Hx   . Malignant hyperthermia Neg Hx   . Pseudochol deficiency Neg Hx   . Heart disease Neg Hx   . Hypertension Neg Hx   . Hyperlipidemia Neg Hx   . Cancer Maternal Grandfather     colon  . GER disease Father   . Insomnia Brother     History   Social History  . Marital Status: Single    Spouse Name: N/A    Number of Children: N/A  . Years of Education: N/A   Occupational History  . Not on file.   Social History Main Topics  . Smoking status: Never Smoker   . Smokeless tobacco: Never Used  . Alcohol Use: No  . Drug Use: No  . Sexually Active: Yes   Birth Control/ Protection: IUD   Other Topics Concern  . Not on file   Social History Narrative   Married, 26yo, 43mo, exercises with running 3-4 x/wk, stay at home mom, catholic    Current Outpatient Prescriptions on File Prior to Visit  Medication Sig Dispense Refill  . levonorgestrel (MIRENA) 20 MCG/24HR IUD 1 each by Intrauterine route once.      . traZODone (DESYREL) 50 MG tablet Take 1 tablet (50 mg total) by mouth at bedtime. For sleep.  30 tablet  0    No Known Allergies  Review of Systems Constitutional: -fever, -chills, -sweats, -unexpected weight change, -decreased appetite, -fatigue Allergy: -sneezing, -itching, -congestion Dermatology: -changing moles, --rash, -lumps ENT: -runny nose, -ear pain, -sore throat, -hoarseness, -sinus pain, -teeth pain, - ringing in ears, -hearing loss, -nosebleeds Cardiology: -chest pain, -palpitations, -swelling, -difficulty breathing when lying flat, -waking up short of breath Respiratory: -cough, -shortness of breath, -difficulty breathing with exercise or exertion, -wheezing, -coughing up blood Gastroenterology: -abdominal pain, -nausea, -vomiting, -diarrhea, +constipation, -blood in stool, -changes in bowel movement, -difficulty swallowing or eating Hematology: -bleeding, -bruising  Musculoskeletal: -joint aches, -muscle aches, -joint swelling, -  back pain, -neck pain, -cramping, -changes in gait Ophthalmology: denies vision changes, eye redness, itching, discharge Urology: -burning with urination, -difficulty urinating, -blood in urine, -urinary frequency, -urgency, -incontinence Neurology: -headache, -weakness, -tingling, -numbness, -memory loss, -falls, -dizziness Psychology: -depressed mood, -agitation, +sleep problems     Objective:   Physical Exam General appearance: alert, no distress, WD/WN, female Skin: no worrisome lesions HEENT: normocephalic, conjunctiva/corneas normal, sclerae anicteric, PERRLA, EOMi, nares patent, no  discharge or erythema, pharynx normal Oral cavity: MMM, tongue normal, teeth in good repair except right lower molar with decay Neck: supple, no lymphadenopathy, no thyromegaly, no masses, normal ROM, no bruits Chest: non tender, normal shape and expansion Heart: RRR, normal S1, S2, no murmurs Lungs: CTA bilaterally, no wheezes, rhonchi, or rales Abdomen: +bs, soft, non tender, non distended, no masses, no hepatomegaly, no splenomegaly, no bruits Back: non tender, normal ROM, no scoliosis Musculoskeletal: upper extremities non tender, no obvious deformity, normal ROM throughout, lower extremities non tender, no obvious deformity, normal ROM throughout Extremities: no edema, no cyanosis, no clubbing Pulses: 2+ symmetric, upper and lower extremities, normal cap refill Neurological: alert, oriented x 3, CN2-12 intact, strength normal upper extremities and lower extremities, sensation normal throughout, DTRs 2+ throughout, no cerebellar signs, gait normal Psychiatric: normal affect, behavior normal, pleasant  Breast/gyn/rectal - deferred to gynecology    Assessment and Plan :    Encounter Diagnoses  Name Primary?  . Routine general medical examination at a health care facility Yes  . Constipation, chronic   . GERD (gastroesophageal reflux disease)   . Dental caries   . Insomnia   . Need for influenza vaccination   . Need for Tdap vaccination      Physical exam - discussed healthy lifestyle, diet, exercise, preventative care, vaccinations, and addressed their concerns.  Handout given.  Constipation - will bring back 3 x FOB home stool cards.  Labs today.   Begin trial of Amitiza BID.    GERD - c/t Protonix, avoid food triggers  Dental caries - f/u with dentist soon  Insomnia - trazodone use prn  Flu vaccine, VIS and counseling given  Tdap vaccine, VIS and counseling given  Follow-up pending labs

## 2012-05-12 ENCOUNTER — Other Ambulatory Visit (INDEPENDENT_AMBULATORY_CARE_PROVIDER_SITE_OTHER): Payer: 59

## 2012-05-12 DIAGNOSIS — K5900 Constipation, unspecified: Secondary | ICD-10-CM

## 2012-05-12 LAB — POC HEMOCCULT BLD/STL (HOME/3-CARD/SCREEN): Fecal Occult Blood, POC: NEGATIVE

## 2012-05-12 NOTE — Progress Notes (Signed)
PT TAKE HOME STOL CARDS CAME IN TODAY I RESULTED THEM THEY WERE ALL NEG

## 2012-05-13 ENCOUNTER — Encounter: Payer: Self-pay | Admitting: Family Medicine

## 2012-06-25 ENCOUNTER — Telehealth: Payer: Self-pay | Admitting: Medical

## 2012-06-25 ENCOUNTER — Other Ambulatory Visit: Payer: Self-pay | Admitting: Medical

## 2012-06-25 MED ORDER — TRAZODONE HCL 50 MG PO TABS: 50.0000 mg | ORAL_TABLET | Freq: Every day | ORAL | Status: DC

## 2012-06-27 NOTE — Telephone Encounter (Signed)
DONE

## 2012-09-01 ENCOUNTER — Telehealth: Payer: Self-pay | Admitting: Medical

## 2012-09-01 ENCOUNTER — Other Ambulatory Visit: Payer: Self-pay | Admitting: Medical

## 2012-09-01 MED ORDER — TRAZODONE HCL 50 MG PO TABS: 50.0000 mg | ORAL_TABLET | Freq: Every day | ORAL | Status: DC

## 2012-09-01 NOTE — Telephone Encounter (Signed)
LMOM TO CB. CLS 

## 2012-09-01 NOTE — Telephone Encounter (Signed)
Have her recheck OV on sleep, constipation.  I'd prefer her not to be on Ambien or Lunesta all the time or very frequently

## 2012-09-01 NOTE — Telephone Encounter (Signed)
Patient states she requested for something else to be called out like Lunesta or something. She said sometimes the Trazadone doesn't work. CLS    She said the Trazadone works for the most part but some days it doesn't. She said that she is trying some of the other things you suggested for constipation and they seem to be working fine for her. CLS

## 2012-09-01 NOTE — Telephone Encounter (Signed)
Trazodone sent. Take daily not prn.    Also, is she taking Amitiza daily for constipation.   We had discussed last visit.

## 2012-09-02 NOTE — Telephone Encounter (Signed)
I explain to the patient that she will need to schedule a OV to discuss her sleep concerns. Patient states that sh will have to call us back to schedule that appointment. CLS

## 2012-10-16 ENCOUNTER — Other Ambulatory Visit: Payer: Self-pay | Admitting: Medical

## 2012-11-10 ENCOUNTER — Other Ambulatory Visit: Payer: Self-pay | Admitting: Medical

## 2012-11-11 ENCOUNTER — Encounter: Payer: Self-pay | Admitting: Internal Medicine

## 2012-11-11 NOTE — Telephone Encounter (Signed)
Is this okay to refill? 

## 2012-11-11 NOTE — Telephone Encounter (Signed)
This encounter was created in error - please disregard.

## 2012-11-20 ENCOUNTER — Encounter: Payer: Self-pay | Admitting: Family Medicine

## 2012-11-20 ENCOUNTER — Ambulatory Visit (INDEPENDENT_AMBULATORY_CARE_PROVIDER_SITE_OTHER): Payer: 59 | Admitting: Family Medicine

## 2012-11-20 VITALS — BP 112/76 | HR 84 | Temp 98.5°F | Ht 66.5 in | Wt 126.0 lb

## 2012-11-20 DIAGNOSIS — J019 Acute sinusitis, unspecified: Secondary | ICD-10-CM

## 2012-11-20 MED ORDER — FLUCONAZOLE 150 MG PO TABS: ORAL_TABLET | ORAL | Status: DC

## 2012-11-20 MED ORDER — AMOXICILLIN-POT CLAVULANATE 875-125 MG PO TABS: 1.0000 | ORAL_TABLET | Freq: Two times a day (BID) | ORAL | Status: DC

## 2012-11-20 NOTE — Patient Instructions (Signed)
  Take antibiotics twice daily for 14 days.  If having some side effects/tolerability issues, and if you are completely better by 7 days, then okay to stop after 10 days of antibiotics.  Otherwise, try and take for full 2 weeks.  If you are NO better by 5-7 days, call to have antibiotics changed (avelox or levaquin)  Continue to use sinus rinses with saline, decongestants as needed, mucinex to keep the mucus thin.

## 2012-11-20 NOTE — Progress Notes (Signed)
Chief Complaint  Patient presents with  . Facial Pain    started with ST over a month ago. Has been experiencing facial pain and pressure, using hot compresses on her face for relief. Took nyquil/dayquil.    Sore throat began about a month ago, then developed congestion, cough. Orthopedist friend (Dr. Thurston Hole)  rx'd 10 days of amoxacillin, finished 2 weeks ago, but has persistent symptoms.  She has 26 year old and 26 yo (who has had frequent infections, PE tubes). Complaining of ongoing sinus pressure in frontal and maxillary sinuses R>L.  Morning cough is productive of yellow-green mucus, also getting yellow mucus when she blows her nose.  Denies ear pain, fullness.  Has persistent irritation of throat, but mostly related to coughing.  Tried Afrin, nasal lavage, mucinex, and OTC meds without benefit.  Past Medical History  Diagnosis Date  . Preterm labor   . Kidney stone 05/01/2011  . GERD (gastroesophageal reflux disease)   . Chronic constipation   . Routine gynecological examination     [facility] gynecology  . Insomnia   . IUD (intrauterine death)     mirena  . History of renal stone    Past Surgical History  Procedure Laterality Date  . Dilation and curettage of uterus    . Breast surgery      augmentation   History   Social History  . Marital Status: Single    Spouse Name: N/A    Number of Children: N/A  . Years of Education: N/A   Occupational History  . Not on file.   Social History Main Topics  . Smoking status: Never Smoker   . Smokeless tobacco: Never Used  . Alcohol Use: Yes     Comment: 1 glass of wine per week.  . Drug Use: No  . Sexually Active: Yes    Birth Control/ Protection: IUD   Other Topics Concern  . Not on file   Social History Narrative   Married, 27yo, 18mo, exercises with running 3-4 x/wk, stay at home mom, catholic   Current outpatient prescriptions:levonorgestrel (MIRENA) 20 MCG/24HR IUD, 1 each by Intrauterine route once., Disp: , Rfl:  ;  traZODone (DESYREL) 50 MG tablet, TAKE 1- 2 TABLETS BY MOUTH EVERY NIGHT AT BEDTIME AS NEEDED FOR SLEEP, Disp: 45 tablet, Rfl: 0  No Known Allergies  ROS:  Denies fevers, chills, nausea, vomiting, diarrhea, skin rash.  Denies chest pain, shortness of breath, or other concerns except as per HPI  PHYSICAL EXAM: BP 112/76  Pulse 84  Temp(Src) 98.5 F (36.9 C) (Oral)  Ht 5' 6.5" (1.689 m)  Wt 126 lb (57.153 kg)  BMI 20.03 kg/m2  Breastfeeding? No  HEENT:  PERRL, conjunctiva clear.  TM's and EAC's normal.  Nasal mucosa moderately edematous, mild erythema, clear mucus present. OP with erythema posteriorly, midline, otherwise normal.  Moist mucus membranes.  Sinuses tender R>L maxillary Neck: no lymphadenopathy or mass Heart: regular rate and rhythm without murmur Lungs: clear bilaterally Skin: no rash Neuro: alert and oriented.  Cranial nerves intact.  Normal strength, gait Psych: normal mood, affect, hygiene; in running clohtes   ASSESSMENT/PLAN:  Acute sinusitis - Plan: amoxicillin-clavulanate (AUGMENTIN) 875-125 MG per tablet, fluconazole (DIFLUCAN) 150 MG tablet  Take antibiotics twice daily for 14 days.  If having some side effects/tolerability issues, and if you are completely better by 7 days, then okay to stop after 10 days of antibiotics.  Otherwise, try and take for full 2 weeks.  If you are NO  better by 5-7 days, call to have antibiotics changed (avelox or levaquin)  Continue to use sinus rinses with saline, decongestants as needed, mucinex to keep the mucus thin.

## 2012-12-05 ENCOUNTER — Telehealth: Payer: Self-pay | Admitting: Medical

## 2012-12-05 ENCOUNTER — Other Ambulatory Visit: Payer: Self-pay | Admitting: Medical

## 2012-12-05 MED ORDER — TRAZODONE HCL 50 MG PO TABS: 50.0000 mg | ORAL_TABLET | Freq: Every day | ORAL | Status: DC

## 2012-12-08 NOTE — Telephone Encounter (Signed)
LM

## 2012-12-19 ENCOUNTER — Telehealth: Payer: Self-pay | Admitting: Medical

## 2012-12-22 ENCOUNTER — Encounter: Payer: Self-pay | Admitting: Medical

## 2012-12-22 ENCOUNTER — Other Ambulatory Visit: Payer: Self-pay | Admitting: Medical

## 2012-12-22 MED ORDER — TRAZODONE HCL 100 MG PO TABS: 100.0000 mg | ORAL_TABLET | Freq: Every day | ORAL | Status: DC

## 2012-12-22 NOTE — Telephone Encounter (Signed)
rx sent

## 2012-12-24 ENCOUNTER — Telehealth: Payer: Self-pay | Admitting: Family Medicine

## 2012-12-24 NOTE — Telephone Encounter (Signed)
Called patient and she took the antibiotic x 2weeks and then was fine the following week. Her symptoms came back the next week, she has been having these same symptoms x 1 week. I explained your recommendations. 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 I told her I would check to see if you still wanted her to treat symptomatically. Thanks.

## 2012-12-24 NOTE — Telephone Encounter (Signed)
Left message informing patient of Dr.Knapp's recommendations.  

## 2012-12-24 NOTE — Telephone Encounter (Signed)
Yes--continue with symptomatic treatment (guaifenesin, decongestants, sinus rinses).  If symptoms persist or worsen, needs OV for eval

## 2012-12-24 NOTE — Telephone Encounter (Signed)
Last visit was over a month ago, was treated with ABX x 2 weeks.  Message doesn't mention when her symptoms recurred.  If symptoms recurred in the last week, may be viral, and to treat symptomatically (as previously discussed).  Return for OV if symptoms >7 days and worsening

## 2013-01-12 ENCOUNTER — Telehealth: Payer: Self-pay | Admitting: Family Medicine

## 2013-01-12 ENCOUNTER — Ambulatory Visit (INDEPENDENT_AMBULATORY_CARE_PROVIDER_SITE_OTHER): Payer: 59 | Admitting: Family Medicine

## 2013-01-12 ENCOUNTER — Encounter: Payer: Self-pay | Admitting: Family Medicine

## 2013-01-12 VITALS — BP 108/60 | HR 72 | Ht 66.5 in | Wt 126.0 lb

## 2013-01-12 DIAGNOSIS — M542 Cervicalgia: Secondary | ICD-10-CM

## 2013-01-12 DIAGNOSIS — M62838 Other muscle spasm: Secondary | ICD-10-CM

## 2013-01-12 MED ORDER — METHOCARBAMOL 500 MG PO TABS: 500.0000 mg | ORAL_TABLET | Freq: Four times a day (QID) | ORAL | Status: DC

## 2013-01-12 MED ORDER — KETOROLAC TROMETHAMINE 60 MG/2ML IM SOLN
60.0000 mg | Freq: Once | INTRAMUSCULAR | Status: AC
  Administered 2013-01-12: 60 mg via INTRAMUSCULAR

## 2013-01-12 MED ORDER — METAXALONE 800 MG PO TABS: 800.0000 mg | ORAL_TABLET | Freq: Three times a day (TID) | ORAL | Status: DC

## 2013-01-12 MED ORDER — NAPROXEN 500 MG PO TABS: 500.0000 mg | ORAL_TABLET | Freq: Two times a day (BID) | ORAL | Status: DC

## 2013-01-12 NOTE — Telephone Encounter (Signed)
I thought it was now available as generic.  If not, then rx robaxin 500mg   2 tablets (1-2) every 8 hrs as needed for muscle spasm #30

## 2013-01-12 NOTE — Telephone Encounter (Signed)
Sent to pharmacy 

## 2013-01-12 NOTE — Patient Instructions (Addendum)
Take the anti-inflammatory twice daily with food until pain is completely resolved.  Use the muscle relaxant (1/2 - 1) as needed for spasm. Use ice for the first 24 hours, then switch to just heat.  You may alternate ice and heat the first day. After using heat, do the stretches as shown (twice daily). Massage will also be helpful  Return if worsening pain, numbness, or weakness develops in the left arm, or pain is now spreading into the arm/hand.

## 2013-01-12 NOTE — Progress Notes (Signed)
Chief Complaint  Patient presents with  . Neck Pain    last night started having left sided neck pain that became worse over about an hour. Did not sleep last night. Not like any neck pain she has ever had.    Last night, while on the couch watching football, she started having soreness in her left neck.  It got worse throughout the night.  She took some leftover pain meds from her wisdom teeth, but nothing helped.  Described the pain as excruciating.  Got temporary relief from ice.  This is more severe than any other neck aches from "sleeping wrong".  Hurts to move head any direction, causing sharp pains.  Denies radiation of pain into upper extremity, no numbness, tingling or weakness.  Denies any injury, car accidents, change in activity.  Hasn't used anti-inflammatory or other meds except as stated above.  Past Medical History  Diagnosis Date  . Preterm labor   . Kidney stone 05/01/2011  . GERD (gastroesophageal reflux disease)   . Chronic constipation   . Routine gynecological examination     [facility] gynecology  . Insomnia   . History of renal stone   . IUD (intrauterine device) in place    Past Surgical History  Procedure Laterality Date  . Dilation and curettage of uterus    . Breast surgery      augmentation   History   Social History  . Marital Status: Single    Spouse Name: N/A    Number of Children: N/A  . Years of Education: N/A   Occupational History  . Not on file.   Social History Main Topics  . Smoking status: Never Smoker   . Smokeless tobacco: Never Used  . Alcohol Use: Yes     Comment: 1 glass of wine per week.  . Drug Use: No  . Sexual Activity: Yes    Birth Control/ Protection: IUD   Other Topics Concern  . Not on file   Social History Narrative   Married, 27yo, 19mo, exercises with running 3-4 x/wk, stay at home mom, catholic   Current Outpatient Prescriptions on File Prior to Visit  Medication Sig Dispense Refill  . levonorgestrel (MIRENA)  20 MCG/24HR IUD 1 each by Intrauterine route once.      . traZODone (DESYREL) 100 MG tablet Take 1 tablet (100 mg total) by mouth at bedtime.  30 tablet  2   No current facility-administered medications on file prior to visit.   No Known Allergies  ROS:  Denies fevers, URI symptoms, cough, shortness of breath, chest pain, bleeding/bruising, rashes. She is having some right wrist pain, and some pain in left fingers for a few months.  PHYSICAL EXAM: BP 108/60  Pulse 72  Ht 5' 6.5" (1.689 m)  Wt 126 lb (57.153 kg)  BMI 20.03 kg/m2 Pleasant female in no acute distress, holding neck somewhat stiffly  L neck--palpable spasm in paraspinous muscles.  Tender at trapezius but no significant spasm noted in this area.  Spine is nontender Normal strength, sensation. Negative Phalen's test, negative Tinel DTR's are 2+ and symmetric   ASSESSMENT/PLAN:  Neck pain on left side - Plan: naproxen (NAPROSYN) 500 MG tablet  Muscle spasms of neck - Plan: metaxalone (SKELAXIN) 800 MG tablet  Toradol injection today. Risks/side effects reviewed of NSAIDs and muscle relaxants.  Take the anti-inflammatory twice daily with food until pain is completely resolved.  You need to wait for 6 hours after the shot you got today before  starting the naproxen. Use the muscle relaxant (1/2 - 1) as needed for spasm. Use ice for the first 24 hours, then switch to just heat.  You may alternate ice and heat the first day. After using heat, do the stretches as shown (twice daily). Massage will also be helpful  Return if worsening pain, numbness, or weakness develops in the left arm, or pain is now spreading into the arm/hand.  She will call if stronger pain med is needed (finished one she had at home)

## 2013-03-16 ENCOUNTER — Ambulatory Visit (INDEPENDENT_AMBULATORY_CARE_PROVIDER_SITE_OTHER): Payer: 59 | Admitting: Medical

## 2013-03-16 ENCOUNTER — Encounter: Payer: Self-pay | Admitting: Medical

## 2013-03-16 ENCOUNTER — Ambulatory Visit: Payer: 59 | Admitting: Medical

## 2013-03-16 VITALS — BP 92/68 | HR 88 | Temp 98.7°F | Resp 16 | Wt 122.0 lb

## 2013-03-16 DIAGNOSIS — Z23 Encounter for immunization: Secondary | ICD-10-CM

## 2013-03-16 DIAGNOSIS — M2540 Effusion, unspecified joint: Secondary | ICD-10-CM

## 2013-03-16 DIAGNOSIS — M25649 Stiffness of unspecified hand, not elsewhere classified: Secondary | ICD-10-CM

## 2013-03-16 DIAGNOSIS — M2550 Pain in unspecified joint: Secondary | ICD-10-CM

## 2013-03-16 LAB — URIC ACID: Uric Acid, Serum: 3.3 mg/dL (ref 2.4–7.0)

## 2013-03-16 LAB — RHEUMATOID FACTOR: Rhuematoid fact SerPl-aCnc: <10 IU/mL (ref <=14)

## 2013-03-16 NOTE — Progress Notes (Signed)
Subjective: Here today for c/o multiple joint pains.  Started in early summer with pain and dull ache in right wrist.   Started wearing a brace for potential carpal tunnel syndrome.  In the last few weeks getting daily aches in left wrist, pains in her finger joints, bilateral shoulder pains, left worse than right, bilateral knee pains. She does report some morning stiffness primarily in the fingers. Feels like her finger joints do swell at times, mildly. She denies any trauma, injury, fall, no muscle aches, no fevers, but her husband felt like she seemed hot at times. She got worried when she found out her uncle was diagnosed with rheumatoid arthritis in his 30s. She has cut back on running due to knee pains.  She is an avid runner.  Otherwise she has been in her normal state of health.  She wants to be screened for RA.  Past Medical History  Diagnosis Date  . Preterm labor   . Kidney stone 05/01/2011  . GERD (gastroesophageal reflux disease)   . Chronic constipation   . Routine gynecological examination     [facility] gynecology  . Insomnia   . History of renal stone   . IUD (intrauterine device) in place    ROS as in subjective  Objective: Filed Vitals:   03/16/13 0954  BP: 92/68  Pulse: 88  Temp: 98.7 F (37.1 C)  Resp: 16    General appearance: alert, no distress, WD/WN  Neck: supple, no lymphadenopathy, no thyromegaly, no masses Heart: RRR, normal S1, S2, no murmurs Lungs: CTA bilaterally, no wheezes, rhonchi, or rales MSK: hands with squeeze test positive, tenderness of several fingers along the PIPs, tender left shoulder mildly to palpation along head of humerus, mild tenderness of left patella and knee joint line, otherwise no obvious deformity, no joint swelling, range of motion normal upper and lower extremities, rest of extremity exam unremarkable Neuro: nonfocal Pulses: 2+ symmetric, upper and lower extremities, normal cap refill  Assessment: Encounter Diagnoses  Name  Primary?  . Polyarthralgia Yes  . Joint swelling   . Morning joint stiffness of finger or hand, unspecified laterality   . Need for influenza vaccination     Plan:  Based on her symptoms we will screen for rheumatoid arthritis with lab panel.  Exam with mild tenderness, but no significant findings Follow-up pending labs

## 2013-03-17 LAB — CYCLIC CITRUL PEPTIDE ANTIBODY, IGG: Cyclic Citrullin Peptide Ab: <2 U/mL (ref 0.0–5.0)

## 2013-03-17 LAB — ANA: Anti Nuclear Antibody(ANA): NEGATIVE

## 2013-03-23 ENCOUNTER — Other Ambulatory Visit: Payer: Self-pay | Admitting: Medical

## 2013-03-23 NOTE — Telephone Encounter (Signed)
Is this okay to refill? 

## 2013-03-24 ENCOUNTER — Ambulatory Visit (INDEPENDENT_AMBULATORY_CARE_PROVIDER_SITE_OTHER): Payer: 59 | Admitting: Medical

## 2013-03-24 ENCOUNTER — Encounter: Payer: Self-pay | Admitting: Medical

## 2013-03-24 VITALS — BP 92/60 | HR 83 | Temp 97.9°F | Resp 16

## 2013-03-24 DIAGNOSIS — M797 Fibromyalgia: Secondary | ICD-10-CM

## 2013-03-24 DIAGNOSIS — IMO0001 Reserved for inherently not codable concepts without codable children: Secondary | ICD-10-CM

## 2013-03-24 DIAGNOSIS — G4700 Insomnia, unspecified: Secondary | ICD-10-CM

## 2013-03-24 NOTE — Progress Notes (Signed)
Subjective: Here today for recheck to discuss.  Last visit we did a rheumatoid screen lab which was normal.  She notes having muscles aches, tender points, joint aches starting back in the summer.  She gets daily aches in left wrist, pains in her finger joints, bilateral shoulder pains, left worse than right, bilateral knee pains. She does report some morning stiffness primarily in the fingers. Feels like her finger joints do swell at times, mildly. She denies any trauma, injury, fall, no fevers, but her husband felt like she seemed hot at times. She got worried when she found out her uncle was diagnosed with rheumatoid arthritis in his 30s. She has cut back on running due to knee pains.  She is an avid runner.  Otherwise she has been in her normal state of health.  She also has been on IUD for 2 years, but is seeing gyn tomorrow to remove the IUD to try and have their last child.   Past Medical History  Diagnosis Date  . Preterm labor   . Kidney stone 05/01/2011  . GERD (gastroesophageal reflux disease)   . Chronic constipation   . Routine gynecological examination     [facility] gynecology  . Insomnia   . History of renal stone   . IUD (intrauterine device) in place    ROS as in subjective  Objective: Filed Vitals:   03/24/13 0925  BP: 92/60  Pulse: 83  Temp: 97.9 F (36.6 C)  Resp: 16    General appearance: alert, no distress, WD/WN  Neck: supple, no lymphadenopathy, no thyromegaly, no masses Heart: RRR, normal S1, S2, no murmurs Lungs: CTA bilaterally, no wheezes, rhonchi, or rales MSK: tender at several points of her muscles including upper back, upper chest, medial arms and legs, basically all the fibromyalgia trigger points, hands with squeeze test positive, tenderness of several fingers along the PIPs, tender left shoulder mildly to palpation along head of humerus, mild tenderness of left patella and knee joint line, otherwise no obvious deformity, no joint swelling, range of  motion normal upper and lower extremities, rest of extremity exam unremarkable Neuro: nonfocal Pulses: 2+ symmetric, upper and lower extremities, normal cap refill  Assessment: Encounter Diagnoses  Name Primary?  . Fibromyalgia Yes  . Insomnia     Plan:  We discussed her normal rheumatoid labs.   Her symptoms - myalgias, arthralgias, tender points on exam, insomnia, fatigue all point to fibromyalgia.   We discussed treatment including exercise, healthy diet, stress reduction, medication options. However, she and husband want to have the IUD removed and start trying for their final child, try to get pregnant.  Thus, medications for fibromyalgia are off limits for now. Advised she start weaning off trazodone as dicussed, can use Benadryl and Tylenol prn.   C/t healthy lifestyle.   F/u with OB/Gyn this week as planned.  Return prn.

## 2013-03-24 NOTE — Patient Instructions (Signed)

## 2013-07-22 ENCOUNTER — Ambulatory Visit (INDEPENDENT_AMBULATORY_CARE_PROVIDER_SITE_OTHER): Payer: 59 | Admitting: Medical

## 2013-07-22 ENCOUNTER — Encounter: Payer: Self-pay | Admitting: Medical

## 2013-07-22 VITALS — BP 102/70 | HR 78 | Temp 98.1°F | Resp 14 | Wt 129.0 lb

## 2013-07-22 DIAGNOSIS — G4700 Insomnia, unspecified: Secondary | ICD-10-CM

## 2013-07-22 DIAGNOSIS — M797 Fibromyalgia: Secondary | ICD-10-CM

## 2013-07-22 DIAGNOSIS — IMO0001 Reserved for inherently not codable concepts without codable children: Secondary | ICD-10-CM

## 2013-07-22 MED ORDER — CYCLOBENZAPRINE HCL 10 MG PO TABS: ORAL_TABLET | ORAL | Status: DC

## 2013-07-22 NOTE — Progress Notes (Signed)
Subjective: Here today for recheck to discuss sleep and pain.  Still using Trazodone 100mg  QHS.  Still has problems with fibromyalgia pain.  Half the time fibromyalgia pain keeps her up, other times just can't fall asleep.  Mind doesn't seem to shut off at times despite healthy diet, regular exercise, relaxation.  C/o insomnia since age 28yo. Put on Lunesta at age 28yo. Has trouble falling asleep. Once she gets to sleep can normally stay asleep. Had used Lunesta on and off in the past. Has failed OTC remedies such as melatonin, benadryl. She was using Unisom daily for months. She denies stress/ anxiety, exercises 5 days per week, no oral intake within 2 hours of bedtime, no tv or reading in bed, no loud distractions in the house at bedtime, no c/o snoring or sleep apnea, no daytime somnolence, and is rested in the mornings. Just has always had trouble getting to sleep.   Objective: Gen: wd, wn, nad Psych: pleasant, answers questions appropriately, good eye contact  Assessment: Encounter Diagnoses  Name Primary?  . Insomnia Yes  . Fibromyalgia    Plan: Discussed her concerns, sleep hygiene, fibromyalgia and insomnia.    Specific recommendations today include:  Begin Flexeril 10mg  muscle relaxer, 1/2-1 tablet at bedtime as needed.  Try using only 3 times this first week.  On the days you use this, just use 1/2 Trazodone that night.     Otherwise, on nights that you don't use the Flexeril, use 100mg /1 whole tablet of trazodone  If 1/2 of Flexeril at bedtime doesn't help after 2-3 tries, you can use a whole 10mg  tablet.   On days when the fibromyalgia is flared up worse, you can use Flexeril 1/2-1 tablet as needed during a particular day,but use caution due to sedation  recheck 58mo.

## 2013-07-22 NOTE — Patient Instructions (Signed)
  Thank you for giving me the opportunity to serve you today.    Your diagnosis today includes: Encounter Diagnoses  Name Primary?  . Insomnia Yes  . Fibromyalgia      Specific recommendations today include:  Begin Flexeril 10mg  muscle relaxer, 1/2-1 tablet at bedtime as needed.  Try using only 3 times this first week.  On the days you use this, just use 1/2 Trazodone that night.     Otherwise, on nights that you don't use the Flexeril, use 100mg /1 whole tablet of trazodone  If 1/2 of Flexeril at bedtime doesn't help after 2-3 tries, you can use a whole 10mg  tablet.   On days when the fibromyalgia is flared up worse, you can use Flexeril 1/2-1 tablet as needed during a particular day,but use caution due to sedation  Follow up: 1 month

## 2013-07-29 ENCOUNTER — Other Ambulatory Visit: Payer: Self-pay | Admitting: Medical

## 2013-07-29 ENCOUNTER — Telehealth: Payer: Self-pay | Admitting: Medical

## 2013-07-29 NOTE — Telephone Encounter (Signed)
I saw her recently.   See how the new recommendations are doing?  What has her experience been on the recent medication plan?

## 2013-07-30 ENCOUNTER — Other Ambulatory Visit: Payer: Self-pay | Admitting: Medical

## 2013-07-30 MED ORDER — TRAZODONE HCL 100 MG PO TABS: 100.0000 mg | ORAL_TABLET | Freq: Every day | ORAL | Status: DC

## 2013-07-30 NOTE — Telephone Encounter (Signed)
Patient states new medication schedule working well. [facility]

## 2013-08-28 ENCOUNTER — Telehealth: Payer: Self-pay | Admitting: Medical

## 2013-08-28 ENCOUNTER — Other Ambulatory Visit: Payer: Self-pay | Admitting: Medical

## 2013-08-28 NOTE — Telephone Encounter (Signed)
Called pharmacy & pt has refills at pharmacy

## 2013-08-28 NOTE — Telephone Encounter (Signed)
She has 5 refills form 07/2013 on file at pharmacy

## 2013-10-01 ENCOUNTER — Telehealth: Payer: Self-pay | Admitting: Family Medicine

## 2013-10-01 NOTE — Telephone Encounter (Signed)
Medical records release to [facility]

## 2014-02-22 ENCOUNTER — Other Ambulatory Visit: Payer: Self-pay | Admitting: Medical

## 2014-02-23 NOTE — Telephone Encounter (Signed)
Is this okay to refill? 

## 2014-03-01 ENCOUNTER — Encounter: Payer: Self-pay | Admitting: Medical

## 2014-04-30 NOTE — L&D Delivery Note (Signed)
Delivery Note At 4:17 PM a viable female was delivered via Vaginal, Spontaneous Delivery (Presentation: Left Occiput Anterior).  APGAR: 9, 9; weight  .   Placenta status: ,spontaneous, bilobed .  Cord: 3 vessels with the following complications: .  Cord pH: not indicated  Anesthesia: Epidural  Episiotomy: None Lacerations: None Suture Repair: n/a Est. Blood Loss (mL):  100cc  Mom to postpartum.  Baby to Couplet care / Skin to Skin.  Eashan Schipani A. 02/15/2015, 4:31 PM

## 2014-06-24 IMAGING — CR DG ABDOMEN ACUTE W/ 1V CHEST
3 series · 3 of 3 positions shown · non-contrast
Comparison: None.

CLINICAL DATA: Abdominal pain and vomiting.

ACUTE ABDOMEN SERIES (ABDOMEN 2 VIEW & CHEST 1 VIEW)

[w chest pa]
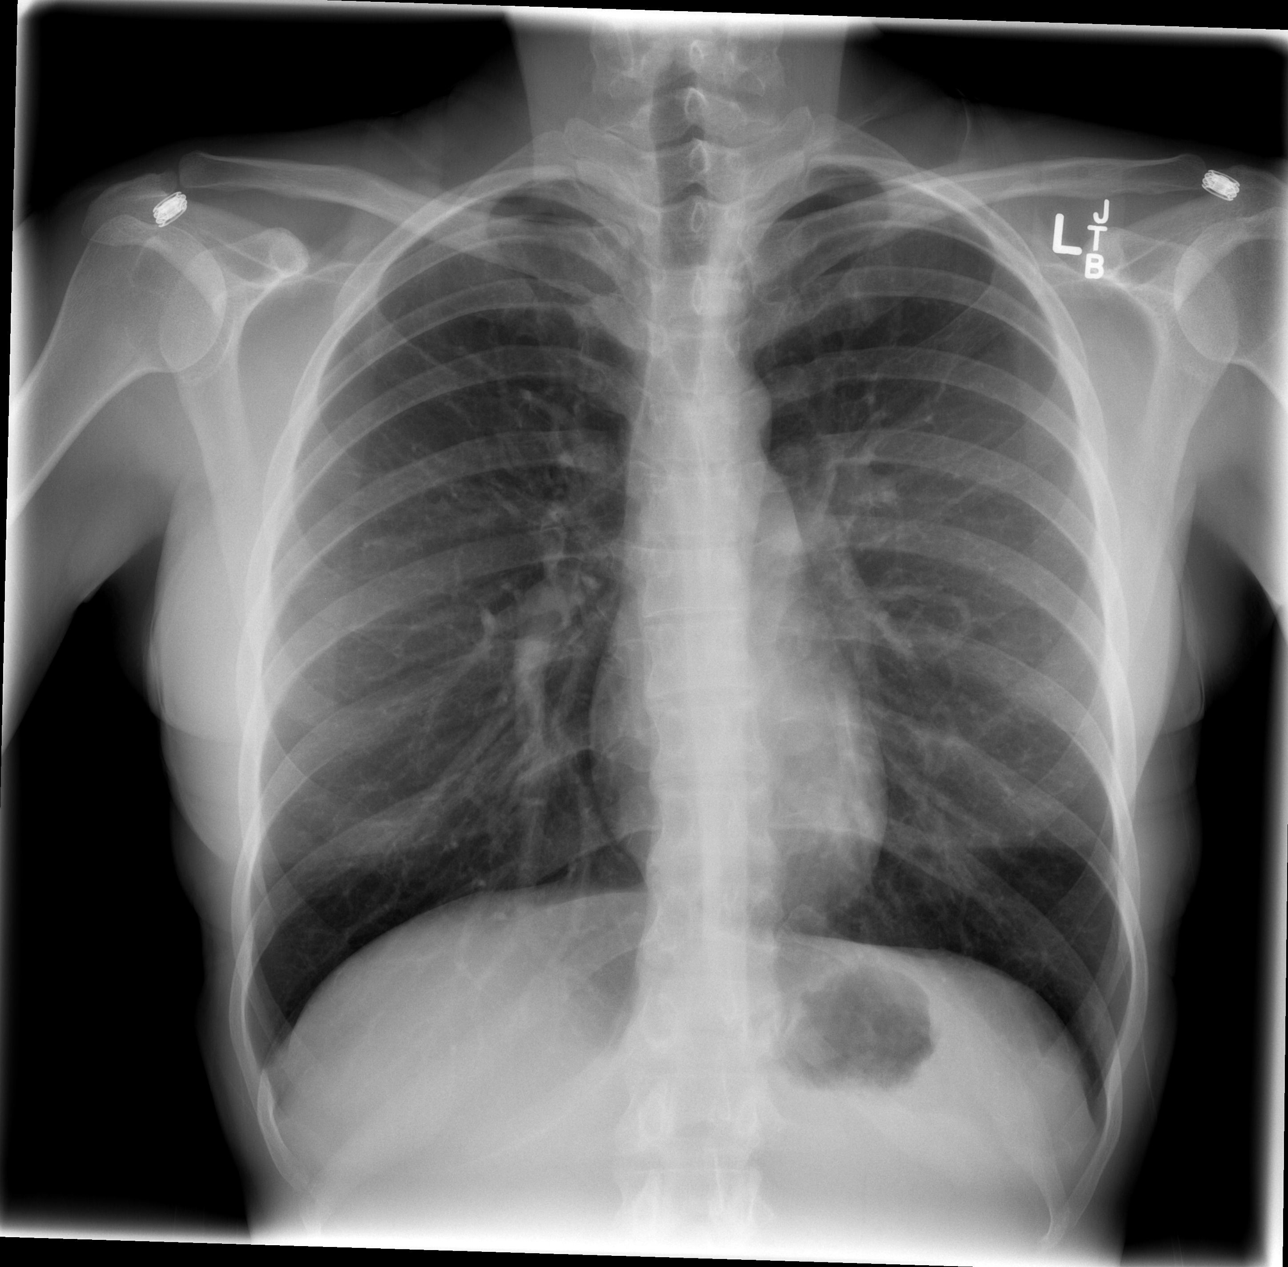

[w abdomen upright]
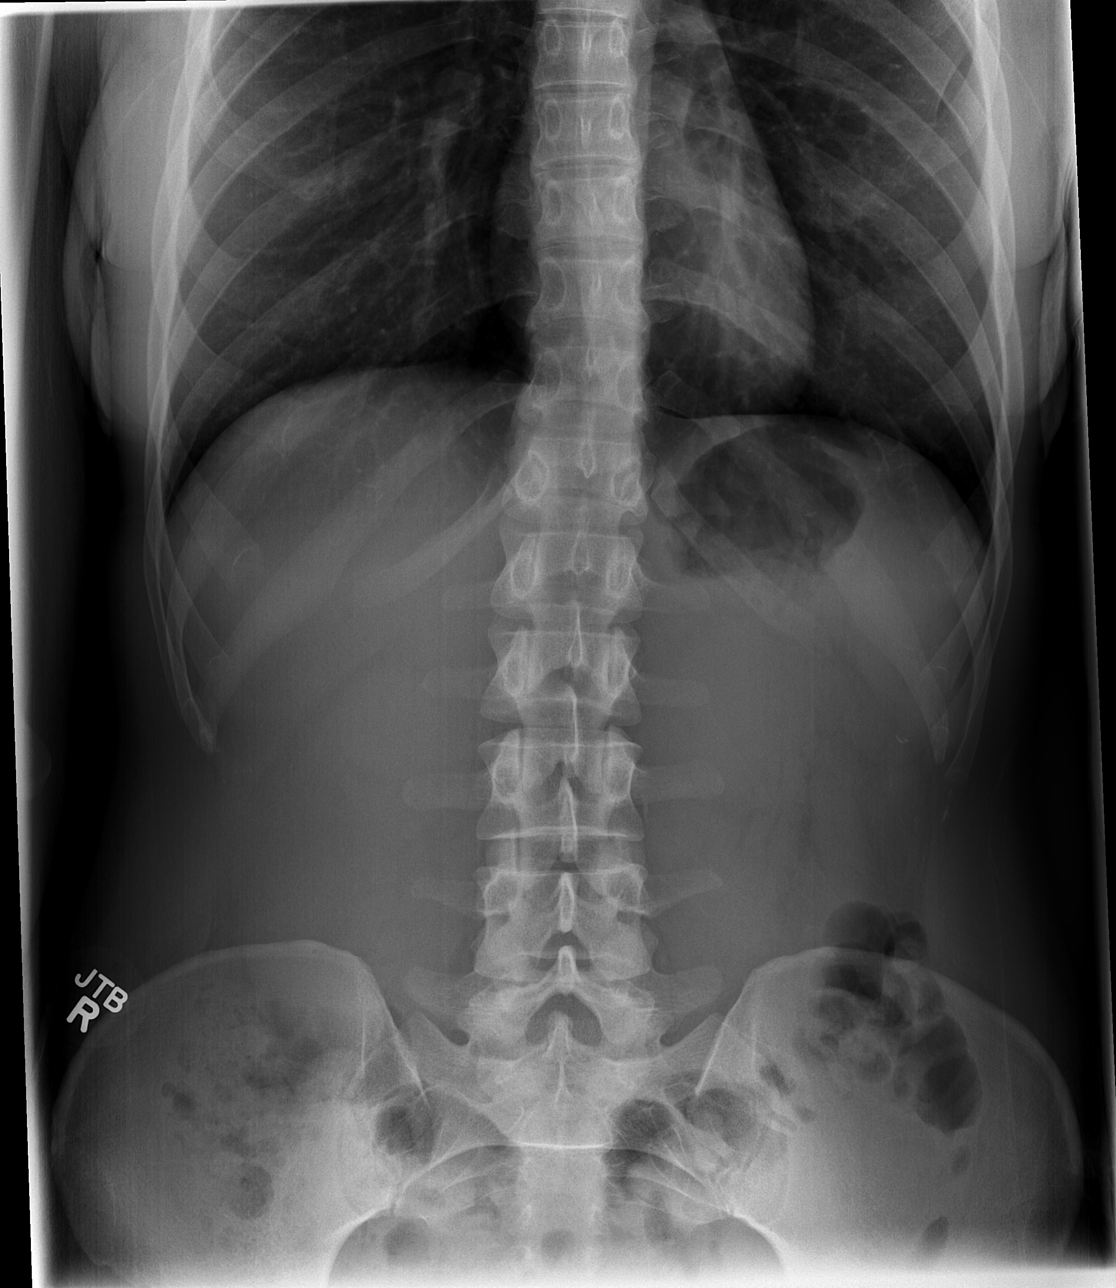

[t abdomen supine]
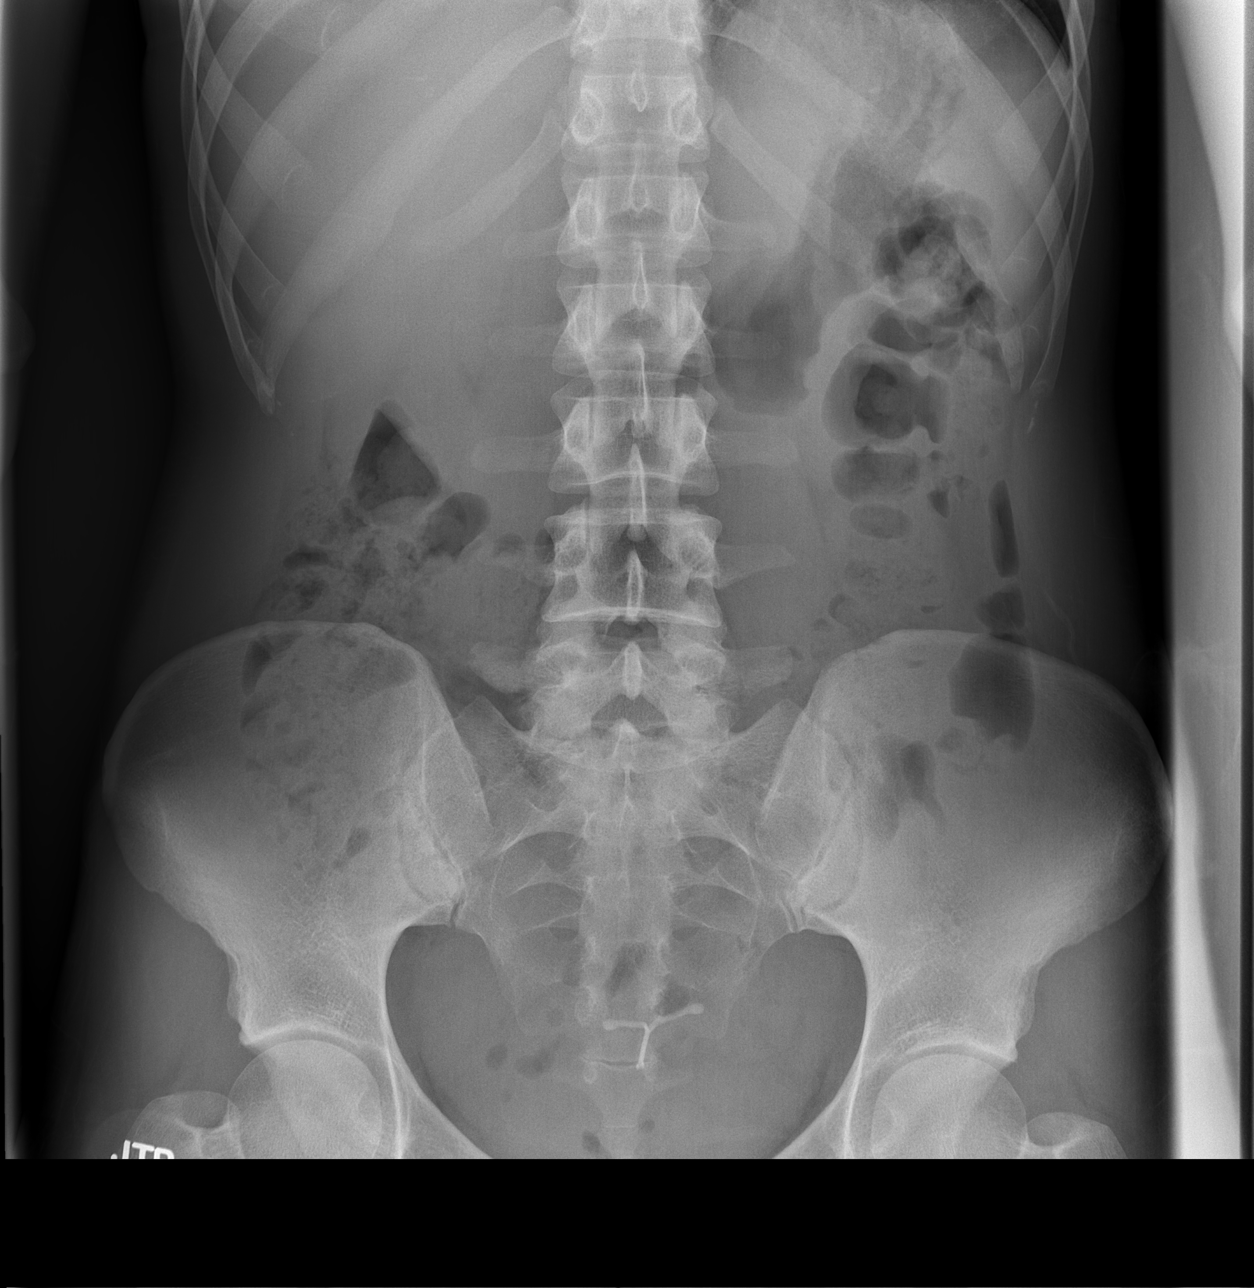

[3 of 3 positions shown; findings below may reference images not displayed]

FINDINGS: Single view of the chest demonstrates clear lungs and
normal heart size.  No pneumothorax or pleural fluid.

Two views of the abdomen show no free intraperitoneal air.  The
bowel gas pattern is normal.  No unexpected abdominal calcification
or focal bony abnormality is identified.  IUD is in place.
IMPRESSION: No acute finding.

## 2014-07-13 LAB — OB RESULTS CONSOLE GC/CHLAMYDIA
Chlamydia: NEGATIVE
Gonorrhea: NEGATIVE

## 2014-07-13 LAB — OB RESULTS CONSOLE ABO/RH: RH TYPE: POSITIVE

## 2014-07-13 LAB — OB RESULTS CONSOLE HEPATITIS B SURFACE ANTIGEN: HEP B S AG: NEGATIVE

## 2014-07-13 LAB — OB RESULTS CONSOLE ANTIBODY SCREEN: Antibody Screen: NEGATIVE

## 2014-07-13 LAB — OB RESULTS CONSOLE RPR: RPR: NONREACTIVE

## 2014-07-13 LAB — OB RESULTS CONSOLE RUBELLA ANTIBODY, IGM: RUBELLA: IMMUNE

## 2014-07-13 LAB — OB RESULTS CONSOLE HIV ANTIBODY (ROUTINE TESTING): HIV: NONREACTIVE

## 2015-01-19 LAB — OB RESULTS CONSOLE GBS: GBS: NEGATIVE

## 2015-02-04 ENCOUNTER — Other Ambulatory Visit: Payer: Self-pay | Admitting: Obstetrics

## 2015-02-11 ENCOUNTER — Inpatient Hospital Stay (HOSPITAL_COMMUNITY)
Admission: AD | Admit: 2015-02-11 | Discharge: 2015-02-11 | Disposition: A | Discharge status: Home / Self Care | Payer: 59 | Source: Ambulatory Visit | Attending: Obstetrics & Gynecology | Admitting: Obstetrics & Gynecology

## 2015-02-11 ENCOUNTER — Encounter (HOSPITAL_COMMUNITY): Payer: Self-pay

## 2015-02-11 DIAGNOSIS — Z3493 Encounter for supervision of normal pregnancy, unspecified, third trimester: Secondary | ICD-10-CM | POA: Diagnosis not present

## 2015-02-11 NOTE — MAU Note (Signed)
Notified provider of patient Barbara Wolfe contracting every 10 min 2/50 which is unchanged from her office visit. Fetus reactive. Provider said to discharge patient

## 2015-02-11 NOTE — Progress Notes (Signed)
Kayren EavesAshley Garvey RN left voice message for Dr. Seymour BarsLavoie.

## 2015-02-11 NOTE — MAU Note (Signed)
Patient presents with contractions starting around 2100 last night

## 2015-02-11 NOTE — Discharge Instructions (Signed)
Fetal Movement Counts Patient Name: __________________________________________________ Patient Due Date: ____________________ Performing a fetal movement count is highly recommended in high-risk pregnancies, but it is good for every pregnant woman to do. Your health care provider may ask you to start counting fetal movements at 28 weeks of the pregnancy. Fetal movements often increase:  After eating a full meal.  After physical activity.  After eating or drinking something sweet or cold.  At rest. Pay attention to when you feel the baby is most active. This will help you notice a pattern of your baby's sleep and wake cycles and what factors contribute to an increase in fetal movement. It is important to perform a fetal movement count at the same time each day when your baby is normally most active.  HOW TO COUNT FETAL MOVEMENTS 1. Find a quiet and comfortable area to sit or lie down on your left side. Lying on your left side provides the best blood and oxygen circulation to your baby. 2. Write down the day and time on a sheet of paper or in a journal. 3. Start counting kicks, flutters, swishes, rolls, or jabs in a 2-hour period. You should feel at least 10 movements within 2 hours. 4. If you do not feel 10 movements in 2 hours, wait 2-3 hours and count again. Look for a change in the pattern or not enough counts in 2 hours. SEEK MEDICAL CARE IF:  You feel less than 10 counts in 2 hours, tried twice.  There is no movement in over an hour.  The pattern is changing or taking longer each day to reach 10 counts in 2 hours.  You feel the baby is not moving as he or she usually does. Date: ____________ Movements: ____________ Start time: ____________ Finish time: ____________  Date: ____________ Movements: ____________ Start time: ____________ Finish time: ____________ Date: ____________ Movements: ____________ Start time: ____________ Finish time: ____________ Date: ____________ Movements:  ____________ Start time: ____________ Finish time: ____________ Date: ____________ Movements: ____________ Start time: ____________ Finish time: ____________ Date: ____________ Movements: ____________ Start time: ____________ Finish time: ____________ Date: ____________ Movements: ____________ Start time: ____________ Finish time: ____________ Date: ____________ Movements: ____________ Start time: ____________ Finish time: ____________  Date: ____________ Movements: ____________ Start time: ____________ Finish time: ____________ Date: ____________ Movements: ____________ Start time: ____________ Finish time: ____________ Date: ____________ Movements: ____________ Start time: ____________ Finish time: ____________ Date: ____________ Movements: ____________ Start time: ____________ Finish time: ____________ Date: ____________ Movements: ____________ Start time: ____________ Finish time: ____________ Date: ____________ Movements: ____________ Start time: ____________ Finish time: ____________ Date: ____________ Movements: ____________ Start time: ____________ Finish time: ____________  Date: ____________ Movements: ____________ Start time: ____________ Finish time: ____________ Date: ____________ Movements: ____________ Start time: ____________ Finish time: ____________ Date: ____________ Movements: ____________ Start time: ____________ Finish time: ____________ Date: ____________ Movements: ____________ Start time: ____________ Finish time: ____________ Date: ____________ Movements: ____________ Start time: ____________ Finish time: ____________ Date: ____________ Movements: ____________ Start time: ____________ Finish time: ____________ Date: ____________ Movements: ____________ Start time: ____________ Finish time: ____________  Date: ____________ Movements: ____________ Start time: ____________ Finish time: ____________ Date: ____________ Movements: ____________ Start time: ____________ Finish  time: ____________ Date: ____________ Movements: ____________ Start time: ____________ Finish time: ____________ Date: ____________ Movements: ____________ Start time: ____________ Finish time: ____________ Date: ____________ Movements: ____________ Start time: ____________ Finish time: ____________ Date: ____________ Movements: ____________ Start time: ____________ Finish time: ____________ Date: ____________ Movements: ____________ Start time: ____________ Finish time: ____________  Date: ____________ Movements: ____________ Start time: ____________ Finish   time: ____________ Date: ____________ Movements: ____________ Start time: ____________ Finish time: ____________ Date: ____________ Movements: ____________ Start time: ____________ Finish time: ____________ Date: ____________ Movements: ____________ Start time: ____________ Finish time: ____________ Date: ____________ Movements: ____________ Start time: ____________ Finish time: ____________ Date: ____________ Movements: ____________ Start time: ____________ Finish time: ____________ Date: ____________ Movements: ____________ Start time: ____________ Finish time: ____________  Date: ____________ Movements: ____________ Start time: ____________ Finish time: ____________ Date: ____________ Movements: ____________ Start time: ____________ Finish time: ____________ Date: ____________ Movements: ____________ Start time: ____________ Finish time: ____________ Date: ____________ Movements: ____________ Start time: ____________ Finish time: ____________ Date: ____________ Movements: ____________ Start time: ____________ Finish time: ____________ Date: ____________ Movements: ____________ Start time: ____________ Finish time: ____________ Date: ____________ Movements: ____________ Start time: ____________ Finish time: ____________  Date: ____________ Movements: ____________ Start time: ____________ Finish time: ____________ Date: ____________  Movements: ____________ Start time: ____________ Finish time: ____________ Date: ____________ Movements: ____________ Start time: ____________ Finish time: ____________ Date: ____________ Movements: ____________ Start time: ____________ Finish time: ____________ Date: ____________ Movements: ____________ Start time: ____________ Finish time: ____________ Date: ____________ Movements: ____________ Start time: ____________ Finish time: ____________ Date: ____________ Movements: ____________ Start time: ____________ Finish time: ____________  Date: ____________ Movements: ____________ Start time: ____________ Finish time: ____________ Date: ____________ Movements: ____________ Start time: ____________ Finish time: ____________ Date: ____________ Movements: ____________ Start time: ____________ Finish time: ____________ Date: ____________ Movements: ____________ Start time: ____________ Finish time: ____________ Date: ____________ Movements: ____________ Start time: ____________ Finish time: ____________ Date: ____________ Movements: ____________ Start time: ____________ Finish time: ____________   This information is not intended to replace advice given to you by your health care provider. Make sure you discuss any questions you have with your health care provider.   Document Released: 05/16/2006 Document Revised: 05/07/2014 Document Reviewed: 02/11/2012 Elsevier Interactive Patient Education 2016 [employer] Braxton Hicks Contractions Contractions of the uterus can occur throughout pregnancy. Contractions are not always a sign that you are in labor.  WHAT ARE BRAXTON HICKS CONTRACTIONS?  Contractions that occur before labor are called Braxton Hicks contractions, or false labor. Toward the end of pregnancy (32-34 weeks), these contractions can develop more often and may become more forceful. This is not true labor because these contractions do not result in opening (dilatation) and thinning of  the cervix. They are sometimes difficult to tell apart from true labor because these contractions can be forceful and people have different pain tolerances. You should not feel embarrassed if you go to the hospital with false labor. Sometimes, the only way to tell if you are in true labor is for your health care provider to look for changes in the cervix. If there are no prenatal problems or other health problems associated with the pregnancy, it is completely safe to be sent home with false labor and await the onset of true labor. HOW CAN YOU TELL THE DIFFERENCE BETWEEN TRUE AND FALSE LABOR? False Labor  The contractions of false labor are usually shorter and not as hard as those of true labor.   The contractions are usually irregular.   The contractions are often felt in the front of the lower abdomen and in the groin.   The contractions may go away when you walk around or change positions while lying down.   The contractions get weaker and are shorter lasting as time goes on.   The contractions do not usually become progressively stronger, regular, and closer together as with true labor.  True Labor 5. Contractions in true   labor last 30-70 seconds, become very regular, usually become more intense, and increase in frequency.  6. The contractions do not go away with walking.  7. The discomfort is usually felt in the top of the uterus and spreads to the lower abdomen and low back.  8. True labor can be determined by your health care provider with an exam. This will show that the cervix is dilating and getting thinner.  WHAT TO REMEMBER  Keep up with your usual exercises and follow other instructions given by your health care provider.   Take medicines as directed by your health care provider.   Keep your regular prenatal appointments.   Eat and drink lightly if you think you are going into labor.   If Braxton Hicks contractions are making you uncomfortable:   Change  your position from lying down or resting to walking, or from walking to resting.   Sit and rest in a tub of warm water.   Drink 2-3 glasses of water. Dehydration may cause these contractions.   Do slow and deep breathing several times an hour.  WHEN SHOULD I SEEK IMMEDIATE MEDICAL CARE? Seek immediate medical care if:  Your contractions become stronger, more regular, and closer together.   You have fluid leaking or gushing from your vagina.   You have a fever.   You pass blood-tinged mucus.   You have vaginal bleeding.   You have continuous abdominal pain.   You have low back pain that you never had before.   You feel your baby's head pushing down and causing pelvic pressure.   Your baby is not moving as much as it used to.    This information is not intended to replace advice given to you by your health care provider. Make sure you discuss any questions you have with your health care provider.   Document Released: 04/16/2005 Document Revised: 04/21/2013 Document Reviewed: 01/26/2013 Elsevier Interactive Patient Education 2016 Elsevier Inc[employer]  

## 2015-02-14 ENCOUNTER — Encounter (HOSPITAL_COMMUNITY): Payer: Self-pay | Admitting: *Deleted

## 2015-02-14 ENCOUNTER — Telehealth (HOSPITAL_COMMUNITY): Payer: Self-pay | Admitting: *Deleted

## 2015-02-14 NOTE — Telephone Encounter (Signed)
Preadmission screen  

## 2015-02-15 ENCOUNTER — Encounter (HOSPITAL_COMMUNITY): Payer: Self-pay

## 2015-02-15 ENCOUNTER — Inpatient Hospital Stay (HOSPITAL_COMMUNITY): Payer: 59 | Admitting: Anesthesiology

## 2015-02-15 ENCOUNTER — Inpatient Hospital Stay (HOSPITAL_COMMUNITY)
Admission: AD | Admit: 2015-02-15 | Discharge: 2015-02-16 | DRG: 775 | Disposition: A | Discharge status: Home / Self Care | Payer: 59 | Source: Ambulatory Visit | Attending: Obstetrics | Admitting: Obstetrics

## 2015-02-15 DIAGNOSIS — Z809 Family history of malignant neoplasm, unspecified: Secondary | ICD-10-CM | POA: Diagnosis not present

## 2015-02-15 DIAGNOSIS — O9962 Diseases of the digestive system complicating childbirth: Secondary | ICD-10-CM | POA: Diagnosis present

## 2015-02-15 DIAGNOSIS — Z3A39 39 weeks gestation of pregnancy: Secondary | ICD-10-CM | POA: Diagnosis not present

## 2015-02-15 DIAGNOSIS — O9902 Anemia complicating childbirth: Secondary | ICD-10-CM | POA: Diagnosis present

## 2015-02-15 DIAGNOSIS — K219 Gastro-esophageal reflux disease without esophagitis: Secondary | ICD-10-CM | POA: Diagnosis present

## 2015-02-15 DIAGNOSIS — Z9889 Other specified postprocedural states: Secondary | ICD-10-CM | POA: Diagnosis present

## 2015-02-15 LAB — CBC
HCT: 35.5 % — ABNORMAL LOW (ref 36.0–46.0)
Hemoglobin: 12.2 g/dL (ref 12.0–15.0)
MCH: 30.4 pg (ref 26.0–34.0)
MCHC: 34.4 g/dL (ref 30.0–36.0)
MCV: 88.5 fL (ref 78.0–100.0)
PLATELETS: 199 10*3/uL (ref 150–400)
RBC: 4.01 MIL/uL (ref 3.87–5.11)
RDW: 12.9 % (ref 11.5–15.5)
WBC: 8.9 10*3/uL (ref 4.0–10.5)

## 2015-02-15 LAB — ABO/RH: ABO/RH(D): B POS

## 2015-02-15 LAB — TYPE AND SCREEN
ABO/RH(D): B POS
ANTIBODY SCREEN: NEGATIVE

## 2015-02-15 LAB — RPR: RPR: NONREACTIVE

## 2015-02-15 MED ORDER — LANOLIN HYDROUS EX OINT: TOPICAL_OINTMENT | CUTANEOUS | Status: DC | PRN

## 2015-02-15 MED ORDER — LACTATED RINGERS IV SOLN
INTRAVENOUS | Status: DC
  Administered 2015-02-15 (×2): 125 mL/h via INTRAVENOUS

## 2015-02-15 MED ORDER — METHYLERGONOVINE MALEATE 0.2 MG/ML IJ SOLN
INTRAMUSCULAR | Status: AC
  Filled 2015-02-15: qty 1

## 2015-02-15 MED ORDER — CITRIC ACID-SODIUM CITRATE 334-500 MG/5ML PO SOLN: 30.0000 mL | ORAL | Status: DC | PRN

## 2015-02-15 MED ORDER — SENNOSIDES-DOCUSATE SODIUM 8.6-50 MG PO TABS
2.0000 | ORAL_TABLET | ORAL | Status: DC
  Administered 2015-02-15: 2 via ORAL
  Filled 2015-02-15: qty 2

## 2015-02-15 MED ORDER — METHYLERGONOVINE MALEATE 0.2 MG PO TABS: 0.2000 mg | ORAL_TABLET | Freq: Four times a day (QID) | ORAL | Status: DC

## 2015-02-15 MED ORDER — ZOLPIDEM TARTRATE 5 MG PO TABS: 5.0000 mg | ORAL_TABLET | Freq: Every evening | ORAL | Status: DC | PRN

## 2015-02-15 MED ORDER — METHYLERGONOVINE MALEATE 0.2 MG/ML IJ SOLN
0.2000 mg | Freq: Once | INTRAMUSCULAR | Status: AC
  Administered 2015-02-15: 0.2 mg via INTRAMUSCULAR

## 2015-02-15 MED ORDER — ONDANSETRON HCL 4 MG PO TABS: 4.0000 mg | ORAL_TABLET | ORAL | Status: DC | PRN

## 2015-02-15 MED ORDER — PRENATAL MULTIVITAMIN CH
1.0000 | ORAL_TABLET | Freq: Every day | ORAL | Status: DC
  Administered 2015-02-16: 1 via ORAL
  Filled 2015-02-15: qty 1

## 2015-02-15 MED ORDER — TERBUTALINE SULFATE 1 MG/ML IJ SOLN: 0.2500 mg | Freq: Once | INTRAMUSCULAR | Status: DC | PRN

## 2015-02-15 MED ORDER — OXYTOCIN BOLUS FROM INFUSION: 500.0000 mL | INTRAVENOUS | Status: DC

## 2015-02-15 MED ORDER — IBUPROFEN 600 MG PO TABS
600.0000 mg | ORAL_TABLET | Freq: Four times a day (QID) | ORAL | Status: DC
  Administered 2015-02-15 – 2015-02-16 (×3): 600 mg via ORAL
  Filled 2015-02-15 (×3): qty 1

## 2015-02-15 MED ORDER — OXYCODONE-ACETAMINOPHEN 5-325 MG PO TABS
1.0000 | ORAL_TABLET | ORAL | Status: DC | PRN
  Administered 2015-02-15: 1 via ORAL
  Filled 2015-02-15: qty 1

## 2015-02-15 MED ORDER — SIMETHICONE 80 MG PO CHEW: 80.0000 mg | CHEWABLE_TABLET | ORAL | Status: DC | PRN

## 2015-02-15 MED ORDER — LIDOCAINE HCL (PF) 1 % IJ SOLN
INTRAMUSCULAR | Status: DC | PRN
  Administered 2015-02-15 (×2): 4 mL

## 2015-02-15 MED ORDER — TETANUS-DIPHTH-ACELL PERTUSSIS 5-2.5-18.5 LF-MCG/0.5 IM SUSP: 0.5000 mL | Freq: Once | INTRAMUSCULAR | Status: DC

## 2015-02-15 MED ORDER — LIDOCAINE HCL (PF) 1 % IJ SOLN
30.0000 mL | INTRAMUSCULAR | Status: DC | PRN
  Filled 2015-02-15: qty 30

## 2015-02-15 MED ORDER — LACTATED RINGERS IV SOLN
500.0000 mL | INTRAVENOUS | Status: DC | PRN
  Administered 2015-02-15: 500 mL via INTRAVENOUS

## 2015-02-15 MED ORDER — EPHEDRINE 5 MG/ML INJ: 10.0000 mg | INTRAVENOUS | Status: DC | PRN

## 2015-02-15 MED ORDER — FLEET ENEMA 7-19 GM/118ML RE ENEM: 1.0000 | ENEMA | RECTAL | Status: DC | PRN

## 2015-02-15 MED ORDER — OXYTOCIN 40 UNITS IN LACTATED RINGERS INFUSION - SIMPLE MED
62.5000 mL/h | INTRAVENOUS | Status: DC
  Filled 2015-02-15: qty 1000

## 2015-02-15 MED ORDER — OXYCODONE-ACETAMINOPHEN 5-325 MG PO TABS: 2.0000 | ORAL_TABLET | ORAL | Status: DC | PRN

## 2015-02-15 MED ORDER — FENTANYL 2.5 MCG/ML BUPIVACAINE 1/10 % EPIDURAL INFUSION (WH - ANES)
14.0000 mL/h | INTRAMUSCULAR | Status: DC | PRN
  Administered 2015-02-15 (×3): 14 mL/h via EPIDURAL
  Filled 2015-02-15 (×2): qty 125

## 2015-02-15 MED ORDER — ONDANSETRON HCL 4 MG/2ML IJ SOLN: 4.0000 mg | INTRAMUSCULAR | Status: DC | PRN

## 2015-02-15 MED ORDER — FAMOTIDINE 20 MG PO TABS
20.0000 mg | ORAL_TABLET | Freq: Every day | ORAL | Status: DC
  Administered 2015-02-16: 20 mg via ORAL
  Filled 2015-02-15: qty 1

## 2015-02-15 MED ORDER — DIPHENHYDRAMINE HCL 25 MG PO CAPS: 25.0000 mg | ORAL_CAPSULE | Freq: Four times a day (QID) | ORAL | Status: DC | PRN

## 2015-02-15 MED ORDER — ACETAMINOPHEN 325 MG PO TABS: 650.0000 mg | ORAL_TABLET | ORAL | Status: DC | PRN

## 2015-02-15 MED ORDER — ONDANSETRON HCL 4 MG/2ML IJ SOLN: 4.0000 mg | Freq: Four times a day (QID) | INTRAMUSCULAR | Status: DC | PRN

## 2015-02-15 MED ORDER — DIPHENHYDRAMINE HCL 50 MG/ML IJ SOLN: 12.5000 mg | INTRAMUSCULAR | Status: DC | PRN

## 2015-02-15 MED ORDER — OXYCODONE-ACETAMINOPHEN 5-325 MG PO TABS
2.0000 | ORAL_TABLET | ORAL | Status: DC | PRN
  Administered 2015-02-15 – 2015-02-16 (×4): 2 via ORAL
  Filled 2015-02-15 (×3): qty 2

## 2015-02-15 MED ORDER — OXYTOCIN 40 UNITS IN LACTATED RINGERS INFUSION - SIMPLE MED: 62.5000 mL/h | INTRAVENOUS | Status: DC

## 2015-02-15 MED ORDER — METHYLERGONOVINE MALEATE 0.2 MG PO TABS
0.2000 mg | ORAL_TABLET | Freq: Four times a day (QID) | ORAL | Status: DC
  Administered 2015-02-15 – 2015-02-16 (×3): 0.2 mg via ORAL
  Filled 2015-02-15 (×3): qty 1

## 2015-02-15 MED ORDER — DIBUCAINE 1 % RE OINT: 1.0000 "application " | TOPICAL_OINTMENT | RECTAL | Status: DC | PRN

## 2015-02-15 MED ORDER — WITCH HAZEL-GLYCERIN EX PADS: 1.0000 "application " | MEDICATED_PAD | CUTANEOUS | Status: DC | PRN

## 2015-02-15 MED ORDER — METHYLERGONOVINE MALEATE 0.2 MG/ML IJ SOLN: 0.2000 mg | Freq: Four times a day (QID) | INTRAMUSCULAR | Status: DC

## 2015-02-15 MED ORDER — OXYTOCIN 40 UNITS IN LACTATED RINGERS INFUSION - SIMPLE MED
1.0000 m[IU]/min | INTRAVENOUS | Status: DC
  Administered 2015-02-15: 16 m[IU]/min via INTRAVENOUS
  Administered 2015-02-15: 14 m[IU]/min via INTRAVENOUS
  Administered 2015-02-15: 2 m[IU]/min via INTRAVENOUS
  Filled 2015-02-15: qty 1000

## 2015-02-15 MED ORDER — PHENYLEPHRINE 40 MCG/ML (10ML) SYRINGE FOR IV PUSH (FOR BLOOD PRESSURE SUPPORT)
80.0000 ug | PREFILLED_SYRINGE | INTRAVENOUS | Status: DC | PRN
  Filled 2015-02-15: qty 20

## 2015-02-15 MED ORDER — BENZOCAINE-MENTHOL 20-0.5 % EX AERO: 1.0000 "application " | INHALATION_SPRAY | CUTANEOUS | Status: DC | PRN

## 2015-02-15 MED ORDER — OXYCODONE-ACETAMINOPHEN 5-325 MG PO TABS
1.0000 | ORAL_TABLET | ORAL | Status: DC | PRN
  Filled 2015-02-15 (×2): qty 1

## 2015-02-15 NOTE — Lactation Note (Signed)
This note was copied from the chart of Barbara Santiago GladLindsey Cuny. Lactation Consultation Note  Patient Name: Barbara Wolfe Today's Date: 02/15/2015  Mom was in the bathroom and RN advised she would be there a while. Left handouts with FOB and instructed him to have mom call at next bf.    Maternal Data    Feeding Feeding Type: Breast Fed Length of feed: 15 min  LATCH Score/Interventions Latch: Grasps breast easily, tongue down, lips flanged, rhythmical sucking.  Audible Swallowing: Spontaneous and intermittent  Type of Nipple: Everted at rest and after stimulation  Comfort (Breast/Nipple): Soft / non-tender     Hold (Positioning): No assistance needed to correctly position infant at breast.  LATCH Score: 10  Lactation Tools Discussed/Used     Consult Status      Barbara Wolfe 02/15/2015, 8:49 PM

## 2015-02-15 NOTE — Progress Notes (Signed)
Notified MD of clots and bleeding

## 2015-02-15 NOTE — Anesthesia Procedure Notes (Signed)
Epidural Patient location during procedure: OB  Staffing Anesthesiologist: Velora Horstman Performed by: anesthesiologist   Preanesthetic Checklist Completed: patient identified, site marked, surgical consent, pre-op evaluation, timeout performed, IV checked, risks and benefits discussed and monitors and equipment checked  Epidural Patient position: sitting Prep: site prepped and draped and DuraPrep Patient monitoring: continuous pulse ox and blood pressure Approach: midline Location: L3-L4 Injection technique: LOR saline  Needle:  Needle type: Tuohy  Needle gauge: 17 G Needle length: 9 cm and 9 Needle insertion depth: 5 cm cm Catheter type: closed end flexible Catheter size: 19 Gauge Catheter at skin depth: 10 cm Test dose: negative  Assessment Events: blood not aspirated, injection not painful, no injection resistance, negative IV test and no paresthesia  Additional Notes Patient identified. Risks/Benefits/Options discussed with patient including but not limited to bleeding, infection, nerve damage, paralysis, failed block, incomplete pain control, headache, blood pressure changes, nausea, vomiting, reactions to medication both or allergic, itching and postpartum back pain. Confirmed with bedside nurse the patient's most recent platelet count. Confirmed with patient that they are not currently taking any anticoagulation, have any bleeding history or any family history of bleeding disorders. Patient expressed understanding and wished to proceed. All questions were answered. Sterile technique was used throughout the entire procedure. Please see nursing notes for vital signs. Test dose was given through epidural catheter and negative prior to continuing to dose epidural or start infusion. Warning signs of high block given to the patient including shortness of breath, tingling/numbness in hands, complete motor block, or any concerning symptoms with instructions to call for help. Patient was  given instructions on fall risk and not to get out of bed. All questions and concerns addressed with instructions to call with any issues or inadequate analgesia.      

## 2015-02-15 NOTE — H&P (Signed)
Barbara Wolfe is a 29 y.o. [id number] at 2351w3d presenting for elective IOL. Pt notes  contractions for the past few days. . Good fetal movement, No vaginal bleeding, not leaking fluid.  [facility] 7 wks - Dated by LMP c/w 7 wk  U/s - Mild anemia, improved with supplements   Prenatal Transfer Tool  Maternal Diabetes: No Genetic Screening: Normal Maternal Ultrasounds/Referrals: Normal Fetal Ultrasounds or other Referrals:  None Maternal Substance Abuse:  No Significant Maternal Medications:  None Significant Maternal Lab Results: None     OB History    Gravida Para Term Preterm AB TAB SAB Ectopic Multiple Living   4 2 2  0 1 1 0 0 0 2     Past Medical History  Diagnosis Date  . Preterm labor   . Kidney stone 05/01/2011  . GERD (gastroesophageal reflux disease)   . Chronic constipation   . Routine gynecological examination     [facility]  . Insomnia   . History of renal stone   . IUD (intrauterine device) in place   . Hx of varicella    Past Surgical History  Procedure Laterality Date  . Dilation and curettage of uterus    . Breast surgery      augmentation   Family History: family history includes Cancer in her maternal grandfather; GER disease in her father; Insomnia in her brother. There is no history of Anesthesia problems, Hypotension, Malignant hyperthermia, Pseudochol deficiency, Heart disease, Hypertension, or Hyperlipidemia. Social History:  reports that she has never smoked. She has never used smokeless tobacco. She reports that she drinks alcohol. She reports that she does not use illicit drugs.  Review of Systems - Negative except discomfort of preg     Height 5' 6.5" (1.689 m), weight 164 lb (74.39 kg).  Physical Exam:  Gen: well appearing, no distress CV: RRR Pulm: CTAB Back: no CVAT Abd: gravid, NT, no RUQ pain LE: no edema, equal bilaterally, non-tender Cvx: 3/80%/ vtx/ -1/ post/ med  Prenatal labs: ABO, Rh:  B/Positive/-- (03/15 0000) Antibody: Negative (03/15 0000) Rubella:  immune RPR: Nonreactive (03/15 0000)  HBsAg: Negative (03/15 0000)  HIV: Non-reactive (03/15 0000)  GBS: Negative (09/21 0000)  1 hr Glucola 99  Genetic screening nl NT, nl AFP Anatomy US nl   Assessment/Plan: 29 y.o. [id number] at 2551w3d Elective IOL GBS neg Pitocin, AROM when active Epidural prn Expect SVD   Iliya Spivack A. 02/15/2015, 8:01 AM

## 2015-02-15 NOTE — Anesthesia Preprocedure Evaluation (Signed)
Anesthesia Evaluation  Patient identified by MRN, date of birth, ID band Patient awake    Reviewed: Allergy & Precautions, NPO status , Patient's Chart, lab work & pertinent test results  History of Anesthesia Complications Negative for: history of anesthetic complications  Airway Mallampati: II  TM Distance: >3 FB Neck ROM: Full    Dental no notable dental hx. (+) Dental Advisory Given   Pulmonary neg pulmonary ROS,    Pulmonary exam normal breath sounds clear to auscultation       Cardiovascular negative cardio ROS Normal cardiovascular exam Rhythm:Regular Rate:Normal     Neuro/Psych negative neurological ROS  negative psych ROS   GI/Hepatic Neg liver ROS, GERD  Medicated and Controlled,  Endo/Other  negative endocrine ROS  Renal/GU negative Renal ROS  negative genitourinary   Musculoskeletal negative musculoskeletal ROS (+)   Abdominal   Peds negative pediatric ROS (+)  Hematology negative hematology ROS (+)   Anesthesia Other Findings   Reproductive/Obstetrics (+) Pregnancy                             Anesthesia Physical Anesthesia Plan  ASA: II  Anesthesia Plan: Epidural   Post-op Pain Management:    Induction:   Airway Management Planned:   Additional Equipment:   Intra-op Plan:   Post-operative Plan:   Informed Consent: I have reviewed the patients History and Physical, chart, labs and discussed the procedure including the risks, benefits and alternatives for the proposed anesthesia with the patient or authorized representative who has indicated his/her understanding and acceptance.     Plan Discussed with:   Anesthesia Plan Comments:         Anesthesia Quick Evaluation

## 2015-02-15 NOTE — Progress Notes (Signed)
S: Doing well, no complaints, pain well controlled with epidural  O: BP 127/74 mmHg  Pulse 107  Temp(Src) 98.3 F (36.8 C) (Oral)  Ht 5' 6.5" (1.689 m)  Wt 164 lb (74.39 kg)  BMI 26.08 kg/m2  SpO2 100%   FHT:  FHR: 130s bpm, variability: moderate,  accelerations:  Present,  decelerations:  Absent UC:   regular, every 2-3 minutes SVE:   Dilation: 4 Effacement (%): 80 Station: -1 Exam by:: dr Ernestina PennaFogleman  AROM clear   A / P:  29 y.o.  Obstetric History   G4   P2   T2   P0   A1   TAB1   SAB0   E0   M0   L2    at 5675w3d IOL, elective, cont pitocin  Fetal Wellbeing:  Category I Pain Control:  Epidural  Anticipated MOD:  NSVD  Dortha Neighbors A. 02/15/2015, 2:13 PM

## 2015-02-16 ENCOUNTER — Encounter (HOSPITAL_COMMUNITY): Payer: Self-pay

## 2015-02-16 LAB — CBC
HCT: 37.6 % (ref 36.0–46.0)
Hemoglobin: 13 g/dL (ref 12.0–15.0)
MCH: 31 pg (ref 26.0–34.0)
MCHC: 34.6 g/dL (ref 30.0–36.0)
MCV: 89.5 fL (ref 78.0–100.0)
PLATELETS: 163 10*3/uL (ref 150–400)
RBC: 4.2 MIL/uL (ref 3.87–5.11)
RDW: 13 % (ref 11.5–15.5)
WBC: 13.4 10*3/uL — AB (ref 4.0–10.5)

## 2015-02-16 MED ORDER — IBUPROFEN 800 MG PO TABS: 800.0000 mg | ORAL_TABLET | Freq: Three times a day (TID) | ORAL | Status: AC | PRN

## 2015-02-16 NOTE — Anesthesia Postprocedure Evaluation (Signed)
  Anesthesia Post-op Note  Patient: Barbara Wolfe  Procedure(s) Performed: * No procedures listed *  Patient Location: Mother/Baby  Anesthesia Type:Epidural  Level of Consciousness: awake, alert  and oriented  Airway and Oxygen Therapy: Patient Spontanous Breathing  Post-op Pain: none  Post-op Assessment: Post-op Vital signs reviewed, Patient's Cardiovascular Status Stable, Respiratory Function Stable and Patent Airway              Post-op Vital Signs: Reviewed and stable  Last Vitals:  Filed Vitals:   02/16/15 0634  BP: 109/76  Pulse: 61  Temp: 36.7 C  Resp: 18    Complications: No apparent anesthesia complications

## 2015-02-16 NOTE — Discharge Summary (Signed)
OB Discharge Summary  Patient Name: Barbara Wolfe DOB: 14-Feb-1986 MRN: [id number]  Date of admission: 02/15/2015 Delivering MD: Noland Fordyce   Date of discharge: 02/16/2015  Admitting diagnosis: INDUCTION Intrauterine pregnancy: [redacted]w[redacted]d     Secondary diagnosis: None     Discharge diagnosis: Term Pregnancy Delivered                                                                                                Post partum procedures:none  Augmentation: AROM and Pitocin  Complications: None . Pt with increased bleeding after delivery and started on po methergine  Hospital course:  Induction of Labor With Vaginal Delivery   29 y.o. yo [id number] at 104w4d was admitted to the hospital 02/15/2015 for induction of labor.  Indication for induction: Favorable cervix at term.  Patient had an uncomplicated labor course as follows: Membrane Rupture Time/Date: 2:01 PM ,02/15/2015   Intrapartum Procedures: Episiotomy: None [1]                                         Lacerations:  None [1]  Patient had delivery of a Viable infant.  Information for the patient's newborn:  Jonay, Hitchcock [[id number]]  Delivery Method: Vaginal, Spontaneous Delivery (Filed from Delivery Summary)   02/15/2015  Details of delivery can be found in separate delivery note.  Patient had a routine postpartum course. Patient is discharged home No discharge date for patient encounter.    Physical exam  Filed Vitals:   02/15/15 2045 02/15/15 2142 02/16/15 0140 02/16/15 0634  BP: 115/73 116/68 117/68 109/76  Pulse: 73 87 78 61  Temp: 98.1 F (36.7 C) 98.4 F (36.9 C) 98 F (36.7 C) 98 F (36.7 C)  TempSrc: Oral Oral Oral Oral  Resp: Height:      Weight:      SpO2:       General: alert Lochia: appropriate Uterine Fundus: firm Incision: N/A DVT Evaluation: No evidence of DVT seen on physical exam. Labs: Lab Results  Component Value Date   WBC 13.4* 02/16/2015   HGB 13.0 02/16/2015    HCT 37.6 02/16/2015   MCV 89.5 02/16/2015   PLT 163 02/16/2015   CMP Latest Ref Rng 05/07/2012  Glucose 70 - 99 mg/dL 76  BUN 6 - 23 mg/dL 12  Creatinine 6.96 - 2.95 mg/dL 2.84  Sodium 132 - 440 mEq/L 138  Potassium 3.5 - 5.3 mEq/L 4.4  Chloride 96 - 112 mEq/L 104  CO2 19 - 32 mEq/L 28  Calcium 8.4 - 10.5 mg/dL 9.4  Total Protein 6.0 - 8.3 g/dL 7.0  Total Bilirubin 0.3 - 1.2 mg/dL 0.9  Alkaline Phos 39 - 117 U/L 46  AST 0 - 37 U/L 15  ALT 0 - 35 U/L 11    Discharge instruction: per After Visit Summary and "Baby and Me Booklet".  Medications:  Current facility-administered medications:  .  acetaminophen (TYLENOL) tablet 650 mg, 650 mg, Oral, Q4H  PRN, Noland FordyceKelly Takai Chiaramonte, MD .  benzocaine-Menthol (DERMOPLAST) 20-0.5 % topical spray 1 application, 1 application, Topical, PRN, Noland FordyceKelly Dequavion Follette, MD .  witch hazel-glycerin (TUCKS) pad 1 application, 1 application, Topical, PRN **AND** dibucaine (NUPERCAINAL) 1 % rectal ointment 1 application, 1 application, Rectal, PRN, Noland FordyceKelly Jehan Ranganathan, MD .  diphenhydrAMINE (BENADRYL) capsule 25 mg, 25 mg, Oral, Q6H PRN, Noland FordyceKelly Saad Buhl, MD .  famotidine (PEPCID) tablet 20 mg, 20 mg, Oral, Daily, Noland FordyceKelly Otillia Cordone, MD .  ibuprofen (ADVIL,MOTRIN) tablet 600 mg, 600 mg, Oral, 4 times per day, Noland FordyceKelly Eugene Zeiders, MD, 600 mg at 02/16/15 0525 .  lanolin ointment, , Topical, PRN, Noland FordyceKelly Mone Commisso, MD .  methylergonovine (METHERGINE) tablet 0.2 mg, 0.2 mg, Oral, QID, 0.2 mg at 02/15/15 2139 **OR** methylergonovine (METHERGINE) injection 0.2 mg, 0.2 mg, Intramuscular, QID, Noland FordyceKelly Janelli Welling, MD .  ondansetron [facility](ZOFRAN) tablet 4 mg, 4 mg, Oral, Q4H PRN **OR** ondansetron (ZOFRAN) injection 4 mg, 4 mg, Intravenous, Q4H PRN, Noland FordyceKelly Elvina Bosch, MD .  oxyCODONE-acetaminophen (PERCOCET/ROXICET) 5-325 MG per tablet 1 tablet, 1 tablet, Oral, Q4H PRN, Noland FordyceKelly Rashanna Christiana, MD .  oxyCODONE-acetaminophen (PERCOCET/ROXICET) 5-325 MG per tablet 2 tablet, 2 tablet, Oral, Q4H PRN, Noland FordyceKelly Ellwyn Ergle, MD, 2  tablet at 02/16/15 0454 .  oxytocin (PITOCIN) IV infusion 40 units in LR 1000 mL, 62.5 mL/hr, Intravenous, Continuous, Noland FordyceKelly Garris Melhorn, MD .  prenatal multivitamin tablet 1 tablet, 1 tablet, Oral, Q1200, Noland FordyceKelly Nuchem Grattan, MD .  senna-docusate (Senokot-S) tablet 2 tablet, 2 tablet, Oral, Q24H, Noland FordyceKelly Averianna Brugger, MD, 2 tablet at 02/15/15 2337 .  simethicone (MYLICON) chewable tablet 80 mg, 80 mg, Oral, PRN, Noland FordyceKelly Wilmer Santillo, MD .  Tdap (BOOSTRIX) injection 0.5 mL, 0.5 mL, Intramuscular, Once, Noland FordyceKelly Kahlan Engebretson, MD .  zolpidem (AMBIEN) tablet 5 mg, 5 mg, Oral, QHS PRN, Noland FordyceKelly Jovonda Selner, MD  Diet: routine diet  Activity: Advance as tolerated. Pelvic rest for 6 weeks.   Outpatient follow up:6 weeks  Postpartum contraception: Not Discussed  Newborn Data: Live born female  Birth Weight: 8 lb (3629 g) APGAR: 9, 9  Baby Feeding: Breast Disposition:home with mother   02/16/2015 Lendon ColonelFOGLEMAN,Raahim Shartzer A., MD

## 2015-02-16 NOTE — Lactation Note (Addendum)
This note was copied from the chart of Barbara Santiago GladLindsey Ketterman. Lactation Consultation Note  Patient Name: Barbara Wolfe FAOZH'YToday's Date: 02/16/2015 Reason for consult: Follow-up assessment  Baby 20 hours old , breast feeding range - 10 -30 mins , Latch score 9-10 . 3 voids, 4 stools , 3 spits. Per  Mom last fed at 1055 for 20 mins.  Per mom  baby was circ'd this am and just woke up. @ consult mom re-latched the  Baby and fed 10 mins , Latch score 9 , with depth. LC offered pillow support mom declined.  LC reviewed basics - sore  Nipple and  Engorgement prevention and tx. Instructed .  Per mom had mastitis early on with her last baby. LC reviewed prevention and encouraged  Mom to get in the habit of changing positions between at least 2 positions - breast massage, hand express,  Checking breast for plugged ducts. Per mom nipples alittle tender, instructed on use comfort gels.  Mother informed of post-discharge support and given phone number to the lactation department, including services  for phone call assistance; out-patient appointments; and breastfeeding support group. List of other breastfeeding resources  in the community given in the handout. Encouraged mother to call for problems or concerns related to breastfeeding.   Maternal Data Has patient been taught Hand Expression?: Yes (per mom familiar )  Feeding Feeding Type: Breast Fed Length of feed: 10 min  LATCH Score/Interventions Latch: Grasps breast easily, tongue down, lips flanged, rhythmical sucking.  Audible Swallowing: Spontaneous and intermittent  Type of Nipple: Everted at rest and after stimulation  Comfort (Breast/Nipple): Soft / non-tender     Hold (Positioning): Assistance needed to correctly position infant at breast and maintain latch. Intervention(s): Breastfeeding basics reviewed;Support Pillows;Position options;Skin to skin  LATCH Score: 9  Lactation Tools Discussed/Used Tools: Pump Breast pump type:  Manual WIC Program: No Pump Review: Setup, frequency, and cleaning;Milk Storage Initiated by:: MAI  Date initiated:: 02/16/15   Consult Status Consult Status: Complete    Barbara Wolfe, Barbara Wolfe 02/16/2015, 11:49 AM

## 2015-02-16 NOTE — Progress Notes (Signed)
Post Partum Day 1 S/P spontaneous vaginal  Feeding: breast Subjective: No HA, SOB, CP, F/C, breast symptoms. Normal vaginal bleeding, no clots at this time, Pt did have increased vaginal bleeding while in LDR, epidural cath left in and po methergine started. No current bleeding. No dizziness.  Pain controlled.  ambulating without symptoms.  voiding without difficulty . Pt interested in early discharge.   Objective: BP 109/76 mmHg  Pulse 61  Temp(Src) 98 F (36.7 C) (Oral)  Resp 18  Ht 5' 6.5" (1.689 m)  Wt 164 lb (74.39 kg)  BMI 26.08 kg/m2  SpO2 100% I&O reviewed.   Physical Exam:  General: alert and cooperative Lochia: appropriate Uterine Fundus: firm DVT Evaluation: No evidence of DVT seen on physical exam. Ext: No c/c/e  Recent Labs  02/15/15 0805 02/16/15 0532  HGB 12.2 13.0  HCT 35.5* 37.6      Assessment/Plan: 29 y.o.  PPD #1 .  normal postpartum exam Continue current postpartum care Ambulate Consent for circumcision, d/c home.    LOS: 1 day   Labrea Eccleston A. 02/16/2015 8:56 AM

## 2016-01-01 ENCOUNTER — Emergency Department (HOSPITAL_COMMUNITY): Payer: 59

## 2016-01-01 ENCOUNTER — Emergency Department (HOSPITAL_COMMUNITY)
Admission: EM | Admit: 2016-01-01 | Discharge: 2016-01-01 | Disposition: A | Discharge status: Home / Self Care | Payer: 59 | Attending: Emergency Medicine | Admitting: Emergency Medicine

## 2016-01-01 ENCOUNTER — Encounter (HOSPITAL_COMMUNITY): Payer: Self-pay | Admitting: Emergency Medicine

## 2016-01-01 DIAGNOSIS — Z79899 Other long term (current) drug therapy: Secondary | ICD-10-CM | POA: Insufficient documentation

## 2016-01-01 DIAGNOSIS — Y999 Unspecified external cause status: Secondary | ICD-10-CM | POA: Diagnosis not present

## 2016-01-01 DIAGNOSIS — W260XXA Contact with knife, initial encounter: Secondary | ICD-10-CM | POA: Diagnosis not present

## 2016-01-01 DIAGNOSIS — S61219A Laceration without foreign body of unspecified finger without damage to nail, initial encounter: Secondary | ICD-10-CM

## 2016-01-01 DIAGNOSIS — S61210A Laceration without foreign body of right index finger without damage to nail, initial encounter: Secondary | ICD-10-CM | POA: Diagnosis not present

## 2016-01-01 DIAGNOSIS — Y9389 Activity, other specified: Secondary | ICD-10-CM | POA: Insufficient documentation

## 2016-01-01 DIAGNOSIS — Y929 Unspecified place or not applicable: Secondary | ICD-10-CM | POA: Insufficient documentation

## 2016-01-01 MED ORDER — LIDOCAINE HCL (PF) 1 % IJ SOLN
5.0000 mL | Freq: Once | INTRAMUSCULAR | Status: AC
  Administered 2016-01-01: 5 mL
  Filled 2016-01-01: qty 5

## 2016-01-01 NOTE — ED Triage Notes (Signed)
Laceration to tip of R index finger from a knife while doing dishes.  Unknown last DT.

## 2016-01-01 NOTE — ED Provider Notes (Signed)
[facility]-EMERGENCY DEPT Provider Note   CSN: [id number] Arrival date & time: 01/01/16  2052  By signing my name below, I, Soijett Blue, attest that this documentation has been prepared under the direction and in the presence of Kerrie Buffalo, NP Electronically Signed: Soijett Blue, ED Scribe. 01/01/16. 10:11 PM.   History   Chief Complaint Chief Complaint  Patient presents with  . Extremity Laceration    HPI Barbara Wolfe is a 30 y.o. female who presents to the Emergency Department complaining of laceration onset 1.5 hours ago. She reports that she cut her right index finger on a knife while she was doing dishes. Pt notes that her bleeding has since stopped, but it was actively bleeding while in triage. Per pt chart review, she is UTD with her tetanus vaccination with her last being in 2014. Pt is having associated symptoms of right index finger pain. She states that she has tried applying pressure without medications for the relief of her symptoms. She denies color change, swelling, and any other symptoms.    The history is provided by the patient. No language interpreter was used.    Past Medical History:  Diagnosis Date  . Chronic constipation   . GERD (gastroesophageal reflux disease)   . History of renal stone   . Hx of varicella   . Insomnia   . IUD (intrauterine device) in place   . Kidney stone 05/01/2011  . Postpartum care following vaginal delivery (10/18) 02/15/2015  . Preterm labor   . Routine gynecological examination    [facility] gynecology    Patient Active Problem List   Diagnosis Date Noted  . Status post induction of labor 02/15/2015  . Postpartum care following vaginal delivery (10/18) 02/15/2015  . Vaginal delivery 02/15/2015  . Constipation, chronic 05/07/2012  . GERD (gastroesophageal reflux disease) 05/07/2012    Past Surgical History:  Procedure Laterality Date  . BREAST SURGERY     augmentation  . DILATION AND CURETTAGE OF UTERUS      OB History     Gravida Para Term Preterm AB Living   4 3 3  0 1 3   SAB TAB Ectopic Multiple Live Births   0 1 0 0 3       Home Medications    Prior to Admission medications   Medication Sig Start Date End Date Taking? Authorizing Provider  ibuprofen (ADVIL,MOTRIN) 800 MG tablet Take 1 tablet (800 mg total) by mouth every 8 (eight) hours as needed. 02/16/15   Noland Fordyce, MD  Prenatal Vit-Fe Fumarate-FA (PRENATAL MULTIVITAMIN) TABS tablet Take 1 tablet by mouth at bedtime.    Historical Provider, MD  ranitidine (ZANTAC) 150 MG tablet Take 150 mg by mouth 2 (two) times daily.    Historical Provider, MD    Family History Family History  Problem Relation Age of Onset  . GER disease Father   . Insomnia Brother   . Cancer Maternal Grandfather     colon  . Anesthesia problems Neg Hx   . Hypotension Neg Hx   . Malignant hyperthermia Neg Hx   . Pseudochol deficiency Neg Hx   . Heart disease Neg Hx   . Hypertension Neg Hx   . Hyperlipidemia Neg Hx     Social History Social History  Substance Use Topics  . Smoking status: Never Smoker  . Smokeless tobacco: Never Used  . Alcohol use Yes     Comment: 1 glass of wine per week.     Allergies   Review  of patient's allergies indicates no known allergies.   Review of Systems Review of Systems  Musculoskeletal: Positive for arthralgias (right index finger). Negative for joint swelling.  Skin: Positive for wound (right index finger laceration). Negative for color change.  All other systems reviewed and are negative.    Physical Exam Updated Vital Signs BP 114/72 (BP Location: Left Arm)   Pulse 85   Temp 98.4 F (36.9 C) (Oral)   Resp 14   Ht 5\' 6"  (1.676 m)   Wt 56.7 kg   SpO2 98%   Breastfeeding? Yes   BMI 20.18 kg/m   Physical Exam  Constitutional: She is oriented to person, place, and time. She appears well-developed and well-nourished. No distress.  HENT:  Head: Normocephalic and atraumatic.  Eyes: EOM are normal.    Neck: Neck supple.  Cardiovascular: Normal rate.   Adequate circulation.   Pulmonary/Chest: Effort normal. No respiratory distress.  Abdominal: She exhibits no distension.  Musculoskeletal: Normal range of motion.  2 cm flap laceration to tip of right index finger. FROM.  Neurological: She is alert and oriented to person, place, and time.  Good touch sensation.   Skin: Skin is warm and dry. Laceration noted.  Psychiatric: She has a normal mood and affect. Her behavior is normal.  Nursing note and vitals reviewed.    ED Treatments / Results  DIAGNOSTIC STUDIES: Oxygen Saturation is 98% on RA, nl by my interpretation.    COORDINATION OF CARE: 9:44 PM Discussed treatment plan with pt at bedside which includes right index finger xray, laceration repair and pt agreed to plan.   Radiology Dg Finger Index Right  Result Date: 01/01/2016 CLINICAL DATA:  Laceration of the right index finger with a knife while washing dishes 1 hour ago. Initial encounter. EXAM: RIGHT INDEX FINGER 2+V COMPARISON:  None. FINDINGS: Laceration is seen along the radial aspect of the distal right index finger. No underlying fracture, dislocation or foreign body. IMPRESSION: Laceration.  Otherwise negative. Electronically Signed   By: Drusilla Kannerhomas  Dalessio M.D.   On: 01/01/2016 21:33    Procedures .Marland Kitchen.Laceration Repair Date/Time: 01/01/2016 9:51 PM Performed by: Janne NapoleonNEESE, [place] Virrueta M Authorized by: Janne NapoleonNEESE, Roanna Reaves M   Consent:    Consent obtained:  Verbal   Consent given by:  Patient   Risks discussed:  Pain and poor wound healing   Alternatives discussed:  No treatment Anesthesia (see MAR for exact dosages):    Anesthesia method:  Local infiltration   Local anesthetic:  Lidocaine 1% w/o epi (1 cc) Laceration details:    Location:  Finger   Finger location:  R index finger   Length (cm):  2 Repair type:    Repair type:  Simple Pre-procedure details:    Preparation:  Patient was prepped and draped in usual sterile  fashion Exploration:    Hemostasis achieved with:  Direct pressure   Wound exploration: wound explored through full range of motion     Wound extent: no underlying fracture noted     Contaminated: no   Treatment:    Area cleansed with:  Betadine   Amount of cleaning:  Standard   Irrigation solution:  Sterile saline   Irrigation method:  Syringe   Visualized foreign bodies/material removed: no   Skin repair:    Repair method:  Sutures   Suture size:  5-0   Suture material:  Prolene   Suture technique:  Simple interrupted   Number of sutures:  4 Approximation:    Approximation:  Close  Vermilion border: well-aligned   Post-procedure details:    Dressing:  Sterile dressing and antibiotic ointment   Patient tolerance of procedure:  Tolerated well, no immediate complications     (including critical care time)  Medications Ordered in ED Medications  lidocaine (PF) (XYLOCAINE) 1 % injection 5 mL (5 mLs Infiltration Given 01/01/16 2241)     Initial Impression / Assessment and Plan / ED Course  I have reviewed the triage vital signs and the nursing notes.  Pertinent imaging results that were available during my care of the patient were reviewed by me and considered in my medical decision making (see chart for details).  Clinical Course    Tetanus UTD. Laceration occurred < 12 hours prior to repair. Discussed laceration care with pt and answered questions. Pt to f-u for suture removal in 7 days and wound check sooner should there be signs of dehiscence or infection. Pt is hemodynamically stable with no complaints prior to dc.     Final Clinical Impressions(s) / ED Diagnoses   Final diagnoses:  Laceration of finger, initial encounter    New Prescriptions Discharge Medication List as of 01/01/2016 10:11 PM     I personally performed the services described in this documentation, which was scribed in my presence. The recorded information has been reviewed and is accurate.      [address] [place], [place] 01/02/16 1610    Jacalyn Lefevre, MD 01/02/16 [id number]

## 2016-01-01 NOTE — Discharge Instructions (Signed)
Follow up in 7 days for suture removal or sooner for any problems.

## 2016-07-10 DIAGNOSIS — G479 Sleep disorder, unspecified: Secondary | ICD-10-CM | POA: Diagnosis not present

## 2016-07-10 DIAGNOSIS — J069 Acute upper respiratory infection, unspecified: Secondary | ICD-10-CM | POA: Diagnosis not present

## 2016-11-14 DIAGNOSIS — G479 Sleep disorder, unspecified: Secondary | ICD-10-CM | POA: Diagnosis not present

## 2017-08-12 DIAGNOSIS — N644 Mastodynia: Secondary | ICD-10-CM | POA: Diagnosis not present

## 2017-08-13 ENCOUNTER — Other Ambulatory Visit: Payer: Self-pay | Admitting: Obstetrics

## 2017-08-13 DIAGNOSIS — N644 Mastodynia: Secondary | ICD-10-CM

## 2017-08-16 ENCOUNTER — Ambulatory Visit
Admission: RE | Admit: 2017-08-16 | Discharge: 2017-08-16 | Disposition: A | Discharge status: Home / Self Care | Payer: 59 | Source: Ambulatory Visit | Attending: Obstetrics | Admitting: Obstetrics

## 2017-08-16 DIAGNOSIS — N644 Mastodynia: Secondary | ICD-10-CM

## 2017-08-16 DIAGNOSIS — N6489 Other specified disorders of breast: Secondary | ICD-10-CM | POA: Diagnosis not present

## 2017-08-16 DIAGNOSIS — R922 Inconclusive mammogram: Secondary | ICD-10-CM | POA: Diagnosis not present

## 2017-11-21 DIAGNOSIS — G479 Sleep disorder, unspecified: Secondary | ICD-10-CM | POA: Diagnosis not present

## 2017-11-26 DIAGNOSIS — M25521 Pain in right elbow: Secondary | ICD-10-CM | POA: Diagnosis not present

## 2017-11-26 DIAGNOSIS — M25512 Pain in left shoulder: Secondary | ICD-10-CM | POA: Diagnosis not present

## 2020-01-25 ENCOUNTER — Emergency Department (HOSPITAL_COMMUNITY)
Admission: EM | Admit: 2020-01-25 | Discharge: 2020-01-25 | Disposition: A | Discharge status: Home / Self Care | Payer: 59 | Attending: Emergency Medicine | Admitting: Emergency Medicine

## 2020-01-25 ENCOUNTER — Emergency Department (HOSPITAL_COMMUNITY): Payer: 59

## 2020-01-25 ENCOUNTER — Other Ambulatory Visit: Payer: Self-pay

## 2020-01-25 ENCOUNTER — Encounter (HOSPITAL_COMMUNITY): Payer: Self-pay

## 2020-01-25 DIAGNOSIS — R10819 Abdominal tenderness, unspecified site: Secondary | ICD-10-CM | POA: Diagnosis not present

## 2020-01-25 DIAGNOSIS — K219 Gastro-esophageal reflux disease without esophagitis: Secondary | ICD-10-CM | POA: Insufficient documentation

## 2020-01-25 DIAGNOSIS — R197 Diarrhea, unspecified: Secondary | ICD-10-CM

## 2020-01-25 DIAGNOSIS — Z20822 Contact with and (suspected) exposure to covid-19: Secondary | ICD-10-CM | POA: Insufficient documentation

## 2020-01-25 LAB — URINALYSIS, ROUTINE W REFLEX MICROSCOPIC
Bilirubin Urine: NEGATIVE
Glucose, UA: NEGATIVE mg/dL
Hgb urine dipstick: NEGATIVE
Ketones, ur: NEGATIVE mg/dL
Nitrite: NEGATIVE
Protein, ur: NEGATIVE mg/dL
Specific Gravity, Urine: 1.014 (ref 1.005–1.030)
pH: 6 (ref 5.0–8.0)

## 2020-01-25 LAB — COMPREHENSIVE METABOLIC PANEL
ALT: 16 U/L (ref 0–44)
AST: 17 U/L (ref 15–41)
Albumin: 4.1 g/dL (ref 3.5–5.0)
Alkaline Phosphatase: 35 U/L — ABNORMAL LOW (ref 38–126)
Anion gap: 6 (ref 5–15)
BUN: 7 mg/dL (ref 6–20)
CO2: 26 mmol/L (ref 22–32)
Calcium: 8.7 mg/dL — ABNORMAL LOW (ref 8.9–10.3)
Chloride: 104 mmol/L (ref 98–111)
Creatinine, Ser: 0.53 mg/dL (ref 0.44–1.00)
GFR calc Af Amer: >60 mL/min (ref >60)
GFR calc non Af Amer: >60 mL/min (ref >60)
Glucose, Bld: 97 mg/dL (ref 70–99)
Potassium: 3.4 mmol/L — ABNORMAL LOW (ref 3.5–5.1)
Sodium: 136 mmol/L (ref 135–145)
Total Bilirubin: 0.3 mg/dL (ref 0.3–1.2)
Total Protein: 7.4 g/dL (ref 6.5–8.1)

## 2020-01-25 LAB — CBC
HCT: 38.6 % (ref 36.0–46.0)
Hemoglobin: 13.1 g/dL (ref 12.0–15.0)
MCH: 32.8 pg (ref 26.0–34.0)
MCHC: 33.9 g/dL (ref 30.0–36.0)
MCV: 96.5 fL (ref 80.0–100.0)
Platelets: 275 10*3/uL (ref 150–400)
RBC: 4 MIL/uL (ref 3.87–5.11)
RDW: 12.5 % (ref 11.5–15.5)
WBC: 8.7 10*3/uL (ref 4.0–10.5)
nRBC: 0 % (ref 0.0–0.2)

## 2020-01-25 LAB — I-STAT BETA HCG BLOOD, ED (MC, WL, AP ONLY): I-stat hCG, quantitative: <5 m[IU]/mL (ref <5)

## 2020-01-25 LAB — RESPIRATORY PANEL BY RT PCR (FLU A&B, COVID)
Influenza A by PCR: NEGATIVE
Influenza B by PCR: NEGATIVE
SARS Coronavirus 2 by RT PCR: NEGATIVE

## 2020-01-25 LAB — LIPASE, BLOOD: Lipase: 31 U/L (ref 11–51)

## 2020-01-25 MED ORDER — SODIUM CHLORIDE 0.9 % IV BOLUS
1000.0000 mL | Freq: Once | INTRAVENOUS | Status: AC
  Administered 2020-01-25: 1000 mL via INTRAVENOUS

## 2020-01-25 MED ORDER — CHOLESTYRAMINE 4 G PO PACK: 4.0000 g | PACK | Freq: Three times a day (TID) | ORAL | 1 refills | Status: DC

## 2020-01-25 MED ORDER — IOHEXOL 300 MG/ML  SOLN
100.0000 mL | Freq: Once | INTRAMUSCULAR | Status: AC | PRN
  Administered 2020-01-25: 100 mL via INTRAVENOUS

## 2020-01-25 MED ORDER — ONDANSETRON HCL 4 MG PO TABS: 4.0000 mg | ORAL_TABLET | Freq: Four times a day (QID) | ORAL | 0 refills | Status: DC

## 2020-01-25 NOTE — ED Provider Notes (Signed)
[facility]-EMERGENCY DEPT Provider Note   CSN: [id number] Arrival date & time: 01/25/20  1008     History Chief Complaint  Patient presents with  . Diarrhea    Barbara Wolfe is a 34 y.o. female.  HPI 34 year old female with a history of chronic constipation, GERD, kidney stones presents to the ER with complaints of frequent watery diarrhea for the last 2-1/2 weeks.  Patient states that approximately 2 weeks ago she had a sudden onset of frequent watery stools.  Denies any blood.  States that she has not been able to eat or drink much because of how frequent and excessive her diarrhea is.  She was seen by her PCP several times, states she had some lab work done but is still awaiting the results.  She states that she was tested for celiac disease which came back negative.  She states that she has been taking Imodium and over-the-counter antidiarrheal medications with little relief.  She has not changed any new medications, has not been taking any antibiotics recently.  She has some mild abdominal pain and cramping, but denies any fevers or chills.  Has had 1 episode of nonbloody nonbilious vomiting over the last 2 weeks, but mostly just has nausea.  Again feels like she cannot eat anything as it "strokes come straight out".  She called her PCP today and was told to come to the ER.  She states that she normally has more constipated, but has not been taking any laxatives since his onset of diarrhea.  Denies any odors or blood or pus in her stool.  She is not vaccinated for Covid.    Past Medical History:  Diagnosis Date  . Chronic constipation   . GERD (gastroesophageal reflux disease)   . History of renal stone   . Hx of varicella   . Insomnia   . IUD (intrauterine device) in place   . Kidney stone 05/01/2011  . Postpartum care following vaginal delivery (10/18) 02/15/2015  . Preterm labor   . Routine gynecological examination    [facility] gynecology    Patient  Active Problem List   Diagnosis Date Noted  . Status post induction of labor 02/15/2015  . Postpartum care following vaginal delivery (10/18) 02/15/2015  . Vaginal delivery 02/15/2015  . Constipation, chronic 05/07/2012  . GERD (gastroesophageal reflux disease) 05/07/2012    Past Surgical History:  Procedure Laterality Date  . AUGMENTATION MAMMAPLASTY Bilateral 2007   saline/ retropectoral   . BREAST SURGERY     augmentation  . DILATION AND CURETTAGE OF UTERUS       OB History    Gravida  4   Para  3   Term  3   Preterm  0   AB  1   Living  3     SAB  0   TAB  1   Ectopic  0   Multiple  0   Live Births  3           Family History  Problem Relation Age of Onset  . GER disease Father   . Insomnia Brother   . Cancer Maternal Grandfather        colon  . Anesthesia problems Neg Hx   . Hypotension Neg Hx   . Malignant hyperthermia Neg Hx   . Pseudochol deficiency Neg Hx   . Heart disease Neg Hx   . Hypertension Neg Hx   . Hyperlipidemia Neg Hx   . Breast cancer Neg  Hx     Social History   Tobacco Use  . Smoking status: Never Smoker  . Smokeless tobacco: Never Used  Substance Use Topics  . Alcohol use: Yes    Comment: 1 glass of wine per week.  . Drug use: No    Home Medications Prior to Admission medications   Medication Sig Start Date End Date Taking? Authorizing Provider  ibuprofen (ADVIL,MOTRIN) 800 MG tablet Take 1 tablet (800 mg total) by mouth every 8 (eight) hours as needed. 02/16/15   Noland Fordyce, MD  ondansetron (ZOFRAN) 4 MG tablet Take 1 tablet (4 mg total) by mouth every 6 (six) hours. 01/25/20   Mare Ferrari, PA-C  Prenatal Vit-Fe Fumarate-FA (PRENATAL MULTIVITAMIN) TABS tablet Take 1 tablet by mouth at bedtime.    [provider]  ranitidine (ZANTAC) 150 MG tablet Take 150 mg by mouth 2 (two) times daily.    [provider]    Allergies    Patient has no known allergies.  Review of Systems   Review  of Systems  Constitutional: Negative for chills and fever.  HENT: Negative for ear pain and sore throat.   Eyes: Negative for pain and visual disturbance.  Respiratory: Negative for cough and shortness of breath.   Cardiovascular: Negative for chest pain and palpitations.  Gastrointestinal: Positive for abdominal pain, diarrhea, nausea and vomiting.  Genitourinary: Negative for dysuria and hematuria.  Musculoskeletal: Negative for arthralgias and back pain.  Skin: Negative for color change and rash.  Neurological: Negative for seizures and syncope.  All other systems reviewed and are negative.   Physical Exam Updated Vital Signs BP 112/79 (BP Location: Left Arm)   Pulse 73   Temp 98.7 F (37.1 C) (Oral)   Resp 16   Ht 5\' 5"  (1.651 m)   Wt 59 kg   SpO2 100%   BMI 21.63 kg/m   Physical Exam Vitals and nursing note reviewed.  Constitutional:      General: She is not in acute distress.    Appearance: She is well-developed. She is not ill-appearing, toxic-appearing or diaphoretic.  HENT:     Head: Normocephalic and atraumatic.  Eyes:     Conjunctiva/sclera: Conjunctivae normal.  Cardiovascular:     Rate and Rhythm: Normal rate and regular rhythm.     Heart sounds: No murmur heard.   Pulmonary:     Effort: Pulmonary effort is normal. No respiratory distress.     Breath sounds: Normal breath sounds.  Abdominal:     Palpations: Abdomen is soft.     Tenderness: There is no abdominal tenderness.     Comments: Very mild very mild bilateral lower abdominal tenderness.  Patient describes it more as a soreness than pain  Musculoskeletal:        General: No signs of injury.     Cervical back: Neck supple.     Left lower leg: No edema.  Skin:    General: Skin is warm and dry.  Neurological:     General: No focal deficit present.     Mental Status: She is alert and oriented to person, place, and time.     ED Results / Procedures / Treatments   Labs (all labs ordered are  listed, but only abnormal results are displayed) Labs Reviewed  COMPREHENSIVE METABOLIC PANEL - Abnormal; Notable for the following components:      Result Value   Potassium 3.4 (*)    Calcium 8.7 (*)    Alkaline Phosphatase 35 (*)  All other components within normal limits  URINALYSIS, ROUTINE W REFLEX MICROSCOPIC - Abnormal; Notable for the following components:   Leukocytes,Ua TRACE (*)    Bacteria, UA FEW (*)    All other components within normal limits  GASTROINTESTINAL PANEL BY PCR, STOOL (REPLACES STOOL CULTURE)  C DIFFICILE QUICK SCREEN W PCR REFLEX  RESPIRATORY PANEL BY RT PCR (FLU A&B, COVID)  LIPASE, BLOOD  CBC  I-STAT BETA HCG BLOOD, ED ([facility], [facility], [facility] ONLY)    EKG None  Radiology No results found.  Procedures Procedures (including critical care time)  Medications Ordered in ED Medications  sodium chloride 0.9 % bolus 1,000 mL (0 mLs Intravenous Stopped 01/25/20 1456)  sodium chloride 0.9 % bolus 1,000 mL (1,000 mLs Intravenous New Bag/Given 01/25/20 1451)  iohexol (OMNIPAQUE) 300 MG/ML solution 100 mL (100 mLs Intravenous Contrast Given 01/25/20 1458)    ED Course  I have reviewed the triage vital signs and the nursing notes.  Pertinent labs & imaging results that were available during my care of the patient were reviewed by me and considered in my medical decision making (see chart for details).    MDM Rules/Calculators/A&P                         35:43 PM: 34 year old female presents to the ER with complaints of 2 and half weeks of watery excessive diarrhea On presentation, she is well-appearing, nontoxic, no acute distress, resting comfortably in the ER bed.  Vitals overall reassuring.  Physical exam with very mild abdominal tenderness.    The differential diagnosis of diarrhea includes but is not limited to Viral- norovirus/rotavirus; Bacterial-Campylobacter,Shigella, Salmonella, Escherichia coli, E. coli 0157:H7, Yersinia enterocolitica, Vibrio cholerae,  Clostridium difficile. Parasitic- Giardia lamblia, Cryptosporidium,Entamoeba histolytica,Cyclospora, Microsporidium. Toxin- Staphylococcus aureus, Bacillus cereus. Noninfectious causes include GI Bleed, Appendicitis, Mesenteric Ischemia, Diverticulitis, Adrenal Crisis, Thyroid Storm, Toxicologic exposures, Antibiotic or drug-associated, inflammatory bowel disease.  CMP and CBC without significant abnormalities.  Lipase normal.  UA without evidence of UTI.  Negative pregnancy.  Stool cultures pending along with Covid test.  Will CT of the abdomen pelvis to rule out diverticulitis, appendicitis  Signed out care to [facility] who will oversee her scan, followup on stool culture and dispo accordingly. Suspect if CT is negative and lab work is normal, she will be stable to go home with GI followup.    Final Clinical Impression(s) / ED Diagnoses Final diagnoses:  Diarrhea, unspecified type    Rx / DC Orders ED Discharge Orders         Ordered    ondansetron (ZOFRAN) 4 MG tablet  Every 6 hours        01/25/20 1515           Leone Brand 01/25/20 1519    Lorre Nick, MD 01/26/20 [phone]

## 2020-01-25 NOTE — ED Triage Notes (Addendum)
Pt c/o diarrhea x3 weeks, states she has sene her PCP multiple times, has been unable to find cause. Went for bloodwork Friday, waiting on results. Denies any vomiting. Diarrhea worsening last 24hrs. Advised by PCP to come to ED

## 2020-01-25 NOTE — Discharge Instructions (Addendum)
Thank you for letting us take care of you today in the ER Your CT scan showed no acute abnormalities. Your lab values are normal and it does not appear that your diarrhea is causing any emergent electrolyte abnormalities or dehydration.  You will need to follow up with a gastroenterologist or your primary care doctor.    Your scan and workup today was overall reassuring. Please call [facility] and schedule an appointment for further evaluation and treatment. Take the nausea medication as needed. Return to the ER for any new or worsening symptoms.

## 2020-01-25 NOTE — ED Provider Notes (Signed)
3:07 PM BP 112/79 (BP Location: Left Arm)   Pulse 73   Temp 98.7 F (37.1 C) (Oral)   Resp 16   Ht 5\' 5"  (1.651 m)   Wt 59 kg   SpO2 100%   BMI 21.63 kg/m  Patient taken in sign out from . C/o diarrhea hourly x 2 weeks. Labs unremarkable. Awaiting CT +/- c-diff/ stool culture    [place], PA-C 01/25/20 1509    01/27/20, MD 01/26/20 1009

## 2023-04-03 ENCOUNTER — Emergency Department (HOSPITAL_BASED_OUTPATIENT_CLINIC_OR_DEPARTMENT_OTHER): Payer: Self-pay

## 2023-04-03 ENCOUNTER — Other Ambulatory Visit: Payer: Self-pay

## 2023-04-03 ENCOUNTER — Emergency Department (HOSPITAL_BASED_OUTPATIENT_CLINIC_OR_DEPARTMENT_OTHER)
Admission: EM | Admit: 2023-04-03 | Discharge: 2023-04-03 | Disposition: A | Discharge status: Home / Self Care | Payer: Self-pay | Attending: Emergency Medicine | Admitting: Emergency Medicine

## 2023-04-03 DIAGNOSIS — M545 Low back pain, unspecified: Secondary | ICD-10-CM | POA: Insufficient documentation

## 2023-04-03 DIAGNOSIS — R109 Unspecified abdominal pain: Secondary | ICD-10-CM | POA: Insufficient documentation

## 2023-04-03 LAB — CBC WITH DIFFERENTIAL/PLATELET
Abs Immature Granulocytes: 0.01 10*3/uL (ref 0.00–0.07)
Basophils Absolute: 0 10*3/uL (ref 0.0–0.1)
Basophils Relative: 0 %
Eosinophils Absolute: 0.1 10*3/uL (ref 0.0–0.5)
Eosinophils Relative: 1 %
HCT: 38.2 % (ref 36.0–46.0)
Hemoglobin: 13.3 g/dL (ref 12.0–15.0)
Immature Granulocytes: 0 %
Lymphocytes Relative: 21 %
Lymphs Abs: 1.3 10*3/uL (ref 0.7–4.0)
MCH: 33 pg (ref 26.0–34.0)
MCHC: 34.8 g/dL (ref 30.0–36.0)
MCV: 94.8 fL (ref 80.0–100.0)
Monocytes Absolute: 0.9 10*3/uL (ref 0.1–1.0)
Monocytes Relative: 15 %
Neutro Abs: 3.9 10*3/uL (ref 1.7–7.7)
Neutrophils Relative %: 63 %
Platelets: 269 10*3/uL (ref 150–400)
RBC: 4.03 MIL/uL (ref 3.87–5.11)
RDW: 12.7 % (ref 11.5–15.5)
WBC: 6.2 10*3/uL (ref 4.0–10.5)
nRBC: 0 % (ref 0.0–0.2)

## 2023-04-03 LAB — URINALYSIS, ROUTINE W REFLEX MICROSCOPIC
Bilirubin Urine: NEGATIVE
Glucose, UA: NEGATIVE mg/dL
Hgb urine dipstick: NEGATIVE
Ketones, ur: 40 mg/dL — AB
Leukocytes,Ua: NEGATIVE
Nitrite: NEGATIVE
Specific Gravity, Urine: 1.028 (ref 1.005–1.030)
pH: 6 (ref 5.0–8.0)

## 2023-04-03 LAB — COMPREHENSIVE METABOLIC PANEL
ALT: 15 U/L (ref 0–44)
AST: 20 U/L (ref 15–41)
Albumin: 4.3 g/dL (ref 3.5–5.0)
Alkaline Phosphatase: 32 U/L — ABNORMAL LOW (ref 38–126)
Anion gap: 9 (ref 5–15)
BUN: 10 mg/dL (ref 6–20)
CO2: 26 mmol/L (ref 22–32)
Calcium: 9 mg/dL (ref 8.9–10.3)
Chloride: 101 mmol/L (ref 98–111)
Creatinine, Ser: 0.65 mg/dL (ref 0.44–1.00)
GFR, Estimated: >60 mL/min (ref >60)
Glucose, Bld: 105 mg/dL — ABNORMAL HIGH (ref 70–99)
Potassium: 3.6 mmol/L (ref 3.5–5.1)
Sodium: 136 mmol/L (ref 135–145)
Total Bilirubin: 1.4 mg/dL — ABNORMAL HIGH (ref <1.2)
Total Protein: 7.4 g/dL (ref 6.5–8.1)

## 2023-04-03 LAB — PREGNANCY, URINE: Preg Test, Ur: NEGATIVE

## 2023-04-03 MED ORDER — NAPROXEN 375 MG PO TABS: 375.0000 mg | ORAL_TABLET | Freq: Two times a day (BID) | ORAL | 0 refills | Status: DC

## 2023-04-03 MED ORDER — IOHEXOL 300 MG/ML  SOLN: 100.0000 mL | Freq: Once | INTRAMUSCULAR | Status: DC | PRN

## 2023-04-03 MED ORDER — CYCLOBENZAPRINE HCL 10 MG PO TABS: 10.0000 mg | ORAL_TABLET | Freq: Two times a day (BID) | ORAL | 0 refills | Status: AC | PRN

## 2023-04-03 MED ORDER — LIDOCAINE 5 % EX PTCH
2.0000 | MEDICATED_PATCH | CUTANEOUS | Status: DC
  Administered 2023-04-03: 2 via TRANSDERMAL
  Filled 2023-04-03: qty 2

## 2023-04-03 MED ORDER — KETOROLAC TROMETHAMINE 30 MG/ML IJ SOLN
15.0000 mg | Freq: Once | INTRAMUSCULAR | Status: AC
  Administered 2023-04-03: 15 mg via INTRAVENOUS
  Filled 2023-04-03: qty 1

## 2023-04-03 MED ORDER — ACETAMINOPHEN 500 MG PO TABS
1000.0000 mg | ORAL_TABLET | Freq: Once | ORAL | Status: AC
  Administered 2023-04-03: 1000 mg via ORAL
  Filled 2023-04-03: qty 2

## 2023-04-03 NOTE — ED Provider Notes (Signed)
[facility] EMERGENCY DEPARTMENT AT [facility] Provider Note   CSN: [id number] Arrival date & time: 04/03/23  4098     History  Chief Complaint  Patient presents with   Flank Pain    Barbara Wolfe is a 37 y.o. female with chronic constipation, GERD, kidney stones presents emergency department for evaluation of bilateral lower back pain that started 2 days ago.  At that time, it was described as a "dull" back pain.  However, last night she reports significant worsening causing her to wake up twice due to the sharp pain. She reports that right side is worse than left. She recently had an IUD placed a week ago causing some spotting but that has since resolved. She took Tylenol at 0630 this morning with no significant relief to symptoms. She denies known recent injury, urinary symptoms, incontinence, saddle paresthesia, IVDU, malignancy.   Flank Pain Pertinent negatives include no chest pain, no abdominal pain, no headaches and no shortness of breath.     Home Medications Prior to Admission medications   Medication Sig Start Date End Date Taking? Authorizing Provider  cyclobenzaprine (FLEXERIL) 10 MG tablet Take 1 tablet (10 mg total) by mouth 2 (two) times daily as needed for up to 5 days for muscle spasms. 04/03/23 04/08/23 Yes Judithann Sheen, PA  naproxen (NAPROSYN) 375 MG tablet Take 1 tablet (375 mg total) by mouth 2 (two) times daily. 04/03/23  Yes Judithann Sheen, PA  cholestyramine (QUESTRAN) 4 g packet Take 1 packet (4 g total) by mouth 3 (three) times daily with meals. 01/25/20   Arthor Captain, PA-C  EPINEPHrine (EPIPEN JR 2-PAK) 0.15 MG/0.3ML injection Inject 0.15 mg into the muscle as needed for anaphylaxis.     [provider]  ibuprofen (ADVIL,MOTRIN) 800 MG tablet Take 1 tablet (800 mg total) by mouth every 8 (eight) hours as needed. Patient not taking: Reported on 01/25/2020 02/16/15   Noland Fordyce, MD  LORazepam (ATIVAN) 1 MG tablet Take 1 mg by mouth  at bedtime.  12/30/19   [provider]  ondansetron (ZOFRAN) 4 MG tablet Take 1 tablet (4 mg total) by mouth every 6 (six) hours. 01/25/20   Mare Ferrari, PA-C  sertraline (ZOLOFT) 100 MG tablet Take 100 mg by mouth daily. 01/14/20   [provider]      Allergies    Patient has no known allergies.    Review of Systems   Review of Systems  Constitutional:  Negative for chills, fatigue and fever.  Respiratory:  Negative for cough, chest tightness, shortness of breath and wheezing.   Cardiovascular:  Negative for chest pain and palpitations.  Gastrointestinal:  Negative for abdominal pain, constipation, diarrhea, nausea and vomiting.  Genitourinary:  Positive for flank pain. Negative for decreased urine volume, difficulty urinating, dysuria, hematuria, urgency, vaginal bleeding and vaginal discharge.  Musculoskeletal:  Positive for back pain.  Neurological:  Negative for dizziness, seizures, weakness, light-headedness, numbness and headaches.    Physical Exam Updated Vital Signs BP 118/86 (BP Location: Right Arm)   Pulse 75   Temp 98.4 F (36.9 C) (Oral)   Resp 20   Ht 5\' 5"  (1.651 m)   Wt 59 kg   SpO2 99%   BMI 21.64 kg/m  Physical Exam Vitals and nursing note reviewed.  Constitutional:      General: She is not in acute distress.    Appearance: Normal appearance.  HENT:     Head: Normocephalic and atraumatic.  Eyes:  Conjunctiva/sclera: Conjunctivae normal.  Cardiovascular:     Rate and Rhythm: Normal rate.  Pulmonary:     Effort: Pulmonary effort is normal. No respiratory distress.  Abdominal:     General: Bowel sounds are normal. There is no distension.     Palpations: Abdomen is soft.     Tenderness: There is no abdominal tenderness. There is no guarding or rebound.     Comments: No peritoneal signs  Musculoskeletal:     Cervical back: Normal range of motion. No rigidity.     Right lower leg: No edema.     Left lower leg: No edema.      Comments: TTP of bilateral parspinous musculature and midspinous process in lumbar region that worsens with bending and twisting  Skin:    General: Skin is warm.     Capillary Refill: Capillary refill takes less than 2 seconds.     Coloration: Skin is not jaundiced or pale.     Findings: No bruising.  Neurological:     Mental Status: She is alert and oriented to person, place, and time. Mental status is at baseline.     Sensory: No sensory deficit.     Motor: No weakness.     Coordination: Coordination normal. Heel to Shin Test normal.     Gait: Gait normal.     Deep Tendon Reflexes: Reflexes normal.     Reflex Scores:      Patellar reflexes are 2+ on the right side and 2+ on the left side.    Comments: Sensation intact of L2-S2 BLE Ambulates without difficulty   ED Results / Procedures / Treatments   Labs (all labs ordered are listed, but only abnormal results are displayed) Labs Reviewed  URINALYSIS, ROUTINE W REFLEX MICROSCOPIC - Abnormal; Notable for the following components:      Result Value   Ketones, ur 40 (*)    Protein, ur TRACE (*)    All other components within normal limits  COMPREHENSIVE METABOLIC PANEL - Abnormal; Notable for the following components:   Glucose, Bld 105 (*)    Alkaline Phosphatase 32 (*)    Total Bilirubin 1.4 (*)    All other components within normal limits  PREGNANCY, URINE  CBC WITH DIFFERENTIAL/PLATELET    EKG None  Radiology CT Renal Stone Study  Result Date: 04/03/2023 CLINICAL DATA:  Abdominal/flank pain, stone suspected. Right flank pain. EXAM: CT ABDOMEN AND PELVIS WITHOUT CONTRAST TECHNIQUE: Multidetector CT imaging of the abdomen and pelvis was performed following the standard protocol without IV contrast. RADIATION DOSE REDUCTION: This exam was performed according to the departmental dose-optimization program which includes automated exposure control, adjustment of the mA and/or kV according to patient size and/or use of iterative  reconstruction technique. COMPARISON:  CT abdomen/pelvis dated January 25, 2020. FINDINGS: Lower chest: No acute abnormality. Hepatobiliary: No focal liver abnormality is seen, within the limits of an unenhanced exam. No gallstones, gallbladder wall thickening, or biliary dilatation. Pancreas: Unremarkable. No pancreatic ductal dilatation or surrounding inflammatory changes. Spleen: Normal in size without focal abnormality. Adrenals/Urinary Tract: Adrenal glands are unremarkable. 3 mm nonobstructing stone in the inferior pole of the left kidney. No left-sided hydronephrosis. No right-sided renal calculi or hydronephrosis. Bladder is unremarkable. Stomach/Bowel: Stomach is within normal limits. Appendix appears normal. No evidence of bowel wall thickening, distention, or inflammatory changes. Vascular/Lymphatic: No significant vascular findings are present. No enlarged abdominal or pelvic lymph nodes. Reproductive: IUD in place near the level of the uterine fundus. Bilateral adnexa  are unremarkable. Other: No abdominal wall hernia or abnormality. No abdominopelvic ascites. Musculoskeletal: No acute or significant osseous findings. IMPRESSION: 1. 3 mm nonobstructing stone in the inferior pole of the left kidney. No left-sided hydronephrosis. 2. No right-sided renal calculi or hydronephrosis. Electronically Signed   By: Hart Robinsons M.D.   On: 04/03/2023 10:50    Procedures Procedures    Medications Ordered in ED Medications  lidocaine (LIDODERM) 5 % 2 patch (2 patches Transdermal Patch Applied 04/03/23 0930)  ketorolac (TORADOL) 30 MG/ML injection 15 mg (15 mg Intravenous Given 04/03/23 0929)  acetaminophen (TYLENOL) tablet 1,000 mg (1,000 mg Oral Given 04/03/23 1031)    ED Course/ Medical Decision Making/ A&P                                 Medical Decision Making Amount and/or Complexity of Data Reviewed Labs: ordered. Radiology: ordered.  Risk OTC drugs. Prescription drug  management.  Patient presents to the ED for concern of lower back pain, this involves an extensive number of treatment options, and is a complaint that carries with it a high risk of complications and morbidity.  The differential diagnosis includes MSK, pyelo, UTI, +hCG, kidney stone   Co morbidities that complicate the patient evaluation  chronic constipation, GERD, kidney stones   Additional history obtained:  Additional history obtained from Family, Nursing, and Outside Medical Records   External records from outside source obtained and reviewed including triage RN note   Lab Tests:  I Ordered, and personally interpreted labs.  The pertinent results include:  UA neg for infection or Hgb   Imaging Studies ordered:  I ordered imaging studies including renal stone study  I independently visualized and interpreted imaging which showed 3 mm nonobstructing kidney stone in left kidney.  No hydronephrosis I agree with the radiologist interpretation    Medicines ordered and prescription drug management:  I ordered medication including toradol, lido  for pain management  Reevaluation of the patient after these medicines showed that the patient improved I have reviewed the patients home medicines and have made adjustments as needed    Problem List / ED Course:  Bilateral lower back pain and tenderness Labs significant for mildly elevated bili however in the absence of abdominal pain, will have patient follow-up with PCP regarding this.  I discussed this with her and she is agreeable UA negative for hemoglobin or UTI Will provide toradol and lido patches for pain CT renal study significant for 3 mm nonobstructing stone in left kidney.  There is no hydronephrosis Pain improved with analgesia Will provide patient with work note for no significant lifting for 2 weeks and to have her rest over the weekend Naproxen and Flexeril sent to pharmacy for MSK  pain   Reevaluation:  After the interventions noted above, I reevaluated the patient and found that they have :improved   Social Determinants of Health:  Has PCP F/U   Dispostion:  After consideration of the diagnostic results and the patients response to treatment, I feel that the patent would benefit from outpatient management with OTC analgesia for likely MSK pain. Discussed findings, disposition, return to ED precautions with patient who agrees with plan. All questions answered to her satisfaction   Dr. Andria Meuse individually assessed patient and agrees with treatment plan       Final Clinical Impression(s) / ED Diagnoses Final diagnoses:  None    Rx / DC Orders  ED Discharge Orders          Ordered    naproxen (NAPROSYN) 375 MG tablet  2 times daily        04/03/23 1121    cyclobenzaprine (FLEXERIL) 10 MG tablet  2 times daily PRN        04/03/23 1121              Judithann Sheen, PA 04/03/23 1126    Anders Simmonds T, DO 04/06/23 [phone]

## 2023-04-03 NOTE — ED Triage Notes (Signed)
R flank / lower back pain since Sunday , started dull , started getting sharp over the past 2 days. PMH kidney stones. Last took Tylenol this morning approx 0630a. Pt report she had her IUD changed last week with no issues since.

## 2023-04-03 NOTE — Discharge Instructions (Addendum)
Thank you for letting us evaluate you today.  Your imaging was significant for a 3 millimeter nonobstructing kidney stone in the kidney which will likely pass on its own.  Pain is likely due to muscle strain from your strenuous job.  I have provided you with naproxen and flexeril (muscle relaxer) to your [facility] pharmacy on [address].  Do not use this with ibuprofen.  Please do not operate heavy machinery or drink alcohol while taking Flexeril as it does make you drowsy.  Recommend taking half to an entire pill in the evening as needed. You may use Tylenol in addition to naprosyn for pain as well as lidocaine patches, icy hot, warm pads as needed. Make sure to rest and not lift more than 5 lbs. Use proper lifting mechanisms.  Please return to emergency department if you experience numbness or tingling in your lower extremities, loss of sensation in genital region, urinary or fecal incontinence

## 2023-08-26 ENCOUNTER — Other Ambulatory Visit: Payer: Self-pay

## 2023-08-26 DIAGNOSIS — N644 Mastodynia: Secondary | ICD-10-CM

## 2023-09-05 ENCOUNTER — Encounter (HOSPITAL_COMMUNITY): Payer: Self-pay

## 2023-09-12 ENCOUNTER — Other Ambulatory Visit: Payer: Self-pay | Admitting: Obstetrics & Gynecology

## 2023-09-12 ENCOUNTER — Other Ambulatory Visit (HOSPITAL_BASED_OUTPATIENT_CLINIC_OR_DEPARTMENT_OTHER): Payer: Self-pay

## 2023-09-12 ENCOUNTER — Ambulatory Visit
Admission: RE | Admit: 2023-09-12 | Discharge: 2023-09-12 | Disposition: A | Discharge status: Home / Self Care | Payer: Self-pay | Source: Ambulatory Visit | Attending: Obstetrics and Gynecology | Admitting: Obstetrics and Gynecology

## 2023-09-12 ENCOUNTER — Telehealth: Payer: Self-pay | Admitting: *Deleted

## 2023-09-12 ENCOUNTER — Telehealth: Payer: Self-pay

## 2023-09-12 ENCOUNTER — Other Ambulatory Visit (HOSPITAL_COMMUNITY)
Admission: RE | Admit: 2023-09-12 | Discharge: 2023-09-12 | Disposition: A | Discharge status: Home / Self Care | Source: Ambulatory Visit | Attending: Obstetrics and Gynecology | Admitting: Obstetrics and Gynecology

## 2023-09-12 ENCOUNTER — Ambulatory Visit: Payer: Self-pay | Admitting: *Deleted

## 2023-09-12 VITALS — BP 116/73 | Wt 152.0 lb

## 2023-09-12 DIAGNOSIS — N644 Mastodynia: Secondary | ICD-10-CM

## 2023-09-12 DIAGNOSIS — N6452 Nipple discharge: Secondary | ICD-10-CM

## 2023-09-12 DIAGNOSIS — N61 Mastitis without abscess: Secondary | ICD-10-CM | POA: Insufficient documentation

## 2023-09-12 DIAGNOSIS — Z1239 Encounter for other screening for malignant neoplasm of breast: Secondary | ICD-10-CM

## 2023-09-12 MED ORDER — DOXYCYCLINE HYCLATE 100 MG PO CAPS
100.0000 mg | ORAL_CAPSULE | Freq: Two times a day (BID) | ORAL | 0 refills | Status: AC
  Filled 2023-09-12: qty 14, 7d supply, fill #0

## 2023-09-12 NOTE — Progress Notes (Signed)
 Barbara Wolfe is a 38 y.o. female who presents to [facility] clinic today with complaint of right breast swelling, pain, and discharge x 6 weeks. Patient states the pain comes and goes. Patient rates the pain at a 2 out of 10. Patient stated the pain started at a 8 out of 10 and has decreased over the past two weeks. Patient states the breast discharge is spontaneous. Patient stated the discharge is a clear/yellowish colored. Patient stated it was bloody one time.    Pap Smear: Pap smear not completed today. Last Pap smear was in October 2024 at the [facility] clinic and was normal per patient. Per patient has no history of an abnormal Pap smear. Last Pap smear result is not available in Epic.   Physical exam: Breasts Right breast larger than left breast that is a change for patient. No skin abnormalities left breast. Right nipple scabbed. No nipple retraction bilateral breasts. No nipple discharge left breast. Expressed a scant amount of clear discharge from the right nipple on exam. Sample of breast discharge sent to Cytology for evaluation. No lymphadenopathy. No lumps palpated bilateral breasts. Complaints of right outer breast pain on exam.     Pelvic/Bimanual Pap is not indicated today per BCCCP guidelines.   Smoking History: Patient has never smoked.   Patient Navigation: Patient education provided. Access to services provided for patient through BCCCP program.   Breast and Cervical Cancer Risk Assessment: Patient has family history of maternal great aunt having breast cancer. Patient has no known genetic mutations or history of radiation treatment to the chest before age 30. Patient does not have history of cervical dysplasia, immunocompromised, or DES exposure in-utero.  Risk Scores as of Encounter on 09/12/2023     Gregary Lean           5-year 0.45%   Lifetime 9.13%            Last calculated by Silas, Ansyi K, CMA on 09/12/2023 at 12:55 PM         A: BCCCP exam without pap smear Complaint of right breast pain, swelling, and discharge.  P: Referred patient to the [facility] for a diagnostic mammogram. Appointment scheduled Thursday, Sep 12, 2023 at 1400.  Stefan Edge, RN 09/12/2023 12:58 PM

## 2023-09-12 NOTE — Progress Notes (Signed)
 Patient with breast infection, evaluated at [facility].  Doxycycline recommended by Dr. Bonne Buster at the [facility].  This was prescribed for patient.  Patient to follow up as recommended as per [facility].  Lenoard Rad, MD (Covering for Dr. Dodie Frees, [facility] Medical Director)

## 2023-09-12 NOTE — Patient Instructions (Signed)
 Explained breast self awareness Barbara Wolfe. Patient did not need a Pap smear today due to last Pap smear was in October 2024 per patient. Let her know [facility] will cover Pap smears every 3 years unless has a history of abnormal Pap smears. Referred patient to the [facility] for a diagnostic mammogram. Appointment scheduled Thursday, Sep 12, 2023 at 1400. Patient aware of appointment and will be there. Barbara Wolfe verbalized understanding.  Destan Franchini, Dela Favor, RN 12:58 PM

## 2023-09-12 NOTE — Telephone Encounter (Signed)
 Called patient and let her know that her antibiotic Doxycycline has been sent to the [facility]. Let her know to take prescription with food. Informed patient that [facility] doesn't cover the serum prolactin. Explained the [facility] and the [employer]. Patient stated she does have a PCP. Offered to refer if needed to the [facility] for follow up. Gave patient my phone number. Patient verbalized understanding.

## 2023-09-12 NOTE — Telephone Encounter (Signed)
 Patient called after leaving the [facility], had diagnostic mammogram, and stated the radiologist (Dr. Roderic City) informed her that she needs antibiotics and lab work. I contacted the [facility], spoke with Dr. Roderic City. Per Dr. Roderic City, recommend 7 day antibiotic, such as Doxycycline, and have serum prolactin labwork for right breast discharge in case it is related to infection. He also stated he was not able to test the color, no discharge on exam, informed patient to monitor, if discharge persists, notices clear or bloody discharge, needs to see her pcp. I did inform Dr. Roderic City that cytology (breast discharge) was collected, per [facility] OV notes of Shawnie Delton, RN, was able to collect a scant amount of clear, yellowish discharge. Reported verbal report per Dr. Roderic City to Shawnie Delton, RN.

## 2023-09-16 LAB — CYTOLOGY - NON PAP

## 2023-09-18 ENCOUNTER — Other Ambulatory Visit: Payer: Self-pay

## 2023-09-18 ENCOUNTER — Ambulatory Visit: Payer: Self-pay | Admitting: *Deleted

## 2023-09-18 ENCOUNTER — Telehealth: Payer: Self-pay | Admitting: *Deleted

## 2023-09-18 DIAGNOSIS — N644 Mastodynia: Secondary | ICD-10-CM

## 2023-09-18 NOTE — Progress Notes (Signed)
 Patient called me today and I let her know we will call her when we receive appointment.

## 2023-09-18 NOTE — Progress Notes (Signed)
 Please refer patient to [facility] for surgical consult based on her breast discharge result for follow up.

## 2023-09-18 NOTE — Telephone Encounter (Signed)
 Patient called and left voicemail for me to call her back after seeing breast discharge result. Called patient back and explained result. Let her know that based on the result to her breast discharge we have sent a referral to [facility] for a surgical consult. Informed her that [facility] will cover the appointment. Told patient that we will call her with appointment once we get appointment from [facility]. Patient verbalized understanding.

## 2023-09-20 ENCOUNTER — Telehealth: Payer: Self-pay

## 2023-09-20 NOTE — Telephone Encounter (Signed)
 Patient left message requesting update on referral to [facility]. I contacted [facility] @ [facility], patient scheduled with Dr. Eli Grizzle on 09/24/2023 at 2:20 pm with a 1:50 pm, needs to completed new patient forms. Patient informed, and stated she would go to appointment as scheduled.

## 2023-09-24 ENCOUNTER — Other Ambulatory Visit: Payer: Self-pay | Admitting: Surgery

## 2023-09-24 DIAGNOSIS — N6452 Nipple discharge: Secondary | ICD-10-CM

## 2023-10-27 ENCOUNTER — Ambulatory Visit
Admission: RE | Admit: 2023-10-27 | Discharge: 2023-10-27 | Disposition: A | Discharge status: Home / Self Care | Source: Ambulatory Visit | Attending: Surgery | Admitting: Surgery

## 2023-10-27 DIAGNOSIS — N6452 Nipple discharge: Secondary | ICD-10-CM

## 2023-10-27 MED ORDER — GADOPICLENOL 0.5 MMOL/ML IV SOLN
7.5000 mL | Freq: Once | INTRAVENOUS | Status: AC | PRN
  Administered 2023-10-27: 7.5 mL via INTRAVENOUS

## 2023-10-28 ENCOUNTER — Ambulatory Visit: Payer: Self-pay | Admitting: Surgery

## 2023-10-28 ENCOUNTER — Other Ambulatory Visit: Payer: Self-pay | Admitting: Surgery

## 2023-10-28 DIAGNOSIS — N6452 Nipple discharge: Secondary | ICD-10-CM

## 2023-10-28 NOTE — Progress Notes (Signed)
 Please call the patient and let them know that their MRI showed a possible 1 cm mass in the upper outer right breast.  Radiology wants to do another ultrasound.  I placed the order.  I also need to see her back in the office after the next ultrasound.

## 2023-10-29 ENCOUNTER — Other Ambulatory Visit: Payer: Self-pay | Admitting: Surgery

## 2023-10-29 DIAGNOSIS — N6452 Nipple discharge: Secondary | ICD-10-CM

## 2023-11-07 ENCOUNTER — Ambulatory Visit
Admission: RE | Admit: 2023-11-07 | Discharge: 2023-11-07 | Disposition: A | Discharge status: Home / Self Care | Source: Ambulatory Visit | Attending: Surgery | Admitting: Surgery

## 2023-11-07 DIAGNOSIS — N6452 Nipple discharge: Secondary | ICD-10-CM

## 2023-11-11 ENCOUNTER — Ambulatory Visit: Payer: Self-pay | Admitting: Surgery

## 2023-11-11 NOTE — H&P (Signed)
 Subjective    Chief Complaint: Follow-up (Follow up  rt breast discharge wi/ atypical ductal epithelial cells present )       History of Present Illness: Barbara Wolfe is a 38 y.o. female who is seen today as an office consultation at the request of Dr. Alger for evaluation of Follow-up (Follow up  rt breast discharge wi/ atypical ductal epithelial cells present ) .   This is a 38 year old female in good health who presents with some right nipple discharge.  The patient underwent bilateral breast augmentation in 2007.  Surgery was performed in [place].  She states that she has saline implants in place.  About 8 weeks ago, the patient developed redness, tenderness, and significant swelling in the central right breast.  She developed some drainage from the right nipple.  Mostly, this was clear but she did notice some blood on at least 1 occasion.  She was evaluated with mammogram and ultrasound that were unremarkable.  Cytology of the nipple discharge shows some atypical ductal epithelial cells.  She was started on antibiotics.  The redness, tenderness, and swelling have improved but the nipple discharge continues.   She was initially seen on 09/24/2023.  MRI performed on 10/27/2023 revealed some asymmetric enhancement of the right nipple.  The right upper outer quadrant showed a 1 cm indeterminant breast mass.  She underwent second look ultrasound of the right breast mass and this appears to be an intramammary lymph node that appears benign.  The patient continues to have some nipple discharge.  This always comes from the center of her nipple.       Medical History:     Problem List     Patient Active Problem List  Diagnosis   Breast infection in female   GERD (gastroesophageal reflux disease)   Primary fibromyalgia syndrome        Past Surgical History       Past Surgical History:  Procedure Laterality Date   Breast Augmentation Surgery   2007        Allergies       Allergies  Allergen Reactions   Venom-Honey Bee Anaphylaxis        Medications Ordered Prior to Encounter        Current Outpatient Medications on File Prior to Visit  Medication Sig Dispense Refill   dextroamphetamine-amphetamine (ADDERALL XR) 20 MG XR capsule TAKE 1 CAPSULE BY MOUTH DAILY IN THE MORNING       dextroamphetamine-amphetamine (ADDERALL XR) 5 MG XR capsule Take 5 mg by mouth once daily as needed       LORazepam (ATIVAN) 1 MG tablet TAKE 1 TABLET BY MOUTH DAILY AT BEDTIME AS NEEDED        No current facility-administered medications on file prior to visit.        Family History  History reviewed. No pertinent family history.      Tobacco Use History  Social History       Tobacco Use  Smoking Status Never  Smokeless Tobacco Never        Social History  Social History         Socioeconomic History   Marital status: Married  Tobacco Use   Smoking status: Never   Smokeless tobacco: Never  Substance and Sexual Activity   Alcohol use: Yes      Comment: Socially   Drug use: Never    Social Drivers of Health        Food Insecurity: No Food Insecurity (  09/12/2023)    Received from [facility]    Hunger Vital Sign     Within the past 12 months, you worried that your food would run out before you got the money to buy more.: Never true     Within the past 12 months, the food you bought just didn't last and you didn't have money to get more.: Never true  Transportation Needs: No Transportation Needs (09/12/2023)    Received from [facility] - Transportation     Lack of Transportation (Medical): No     Lack of Transportation (Non-Medical): No  Housing Stability: Unknown (09/24/2023)    Housing Stability Vital Sign     Homeless in the Last Year: No        Objective:         Vitals:    11/11/23 1041  Temp: 36.8 C (98.3 F)  PainSc: 0-No pain    There is no height or weight on file to calculate BMI.   Physical Exam     Constitutional:  WDWN in NAD, conversant, no obvious deformities; lying in bed comfortably Eyes:  Pupils equal, round; sclera anicteric; moist conjunctiva; no lid lag HENT:  Oral mucosa moist; good dentition  Neck:  No masses palpated, trachea midline; no thyromegaly Lungs:  CTA bilaterally; normal respiratory effort Breasts: symmetric, no nipple changes on left; right nipple shows some central crusting and mild tenderness with some visible drainage from the center of the nipple.  No palpable masses or lymphadenopathy on either side  CV:  Regular rate and rhythm; no murmurs; extremities well-perfused with no edema Abd:  +bowel sounds, soft, non-tender, no palpable organomegaly; no palpable hernias Musc: Normal gait; no apparent clubbing or cyanosis in extremities Lymphatic:  No palpable cervical or axillary lymphadenopathy Skin:  Warm, dry; no sign of jaundice Psychiatric - alert and oriented x 4; calm mood and affect     Labs, Imaging and Diagnostic Testing: CLINICAL DATA:  38 year old female presents with RIGHT nipple discharge and RIGHT nipple scabbing/crusting. Patient with bilateral breast implants.   EXAM: BILATERAL BREAST MRI WITH AND WITHOUT CONTRAST   TECHNIQUE: Multiplanar, multisequence MR images of both breasts were obtained prior to and following the intravenous administration of 7 ml of Vueway    Three-dimensional MR images were rendered by post-processing of the original MR data on an independent workstation. The three-dimensional MR images were interpreted, and findings are reported in the following complete MRI report for this study. Three dimensional images were evaluated at the independent interpreting workstation using the DynaCAD thin client.   COMPARISON:  09/12/2023 mammogram, ultrasound and prior studies   FINDINGS: Breast composition: c. Heterogeneous fibroglandular tissue.   Background parenchymal enhancement: Mild   Right breast: There is  asymmetric enhancement of the anterior aspect of the RIGHT nipple (image 79: Series 7).   A 1 cm enhancing mass within the posterior UPPER-OUTER RIGHT breast (58:7) may represent a lymph node but cannot be characterized definitely on this exam.   No other suspicious abnormalities noted within the RIGHT breast. A retropectoral implant is present.   Left breast: No suspicious mass or worrisome enhancement. A retropectoral implant is present.   Lymph nodes: No abnormal appearing lymph nodes.   Ancillary findings:  None.   IMPRESSION: 1. Asymmetric enhancement of the anterior RIGHT nipple. Given physical exam demonstrating scabbing/crusting and nipple discharge demonstrating atypical cells, nipple biopsy is recommended. 2. Indeterminate 1 cm posterior UPPER-OUTER RIGHT breast mass. 2nd-look ultrasound is recommended.  3. No other suspicious abnormalities within either breast. No abnormal appearing lymph nodes.   RECOMMENDATION: RIGHT nipple biopsy.   2nd-look RIGHT breast ultrasound.   BI-RADS CATEGORY  4: Suspicious.     Electronically Signed   By: Reyes Phi M.D.   On: 10/28/2023 12:23   CLINICAL DATA:  Patient with history of RIGHT nipple irritation and discharge. Touch prep of the RIGHT nipple demonstrated atypical ductal epithelial cells in May 2025. Recent MRI demonstrated asymmetric enhancement of the RIGHT nipple (for which surgical consultation was recommended) as well as a indeterminate mass in the RIGHT upper outer quadrant which was favored to reflect a lymph node but could not be definitively characterized as such. Patient presents for evaluation of this RIGHT upper outer quadrant mass.   EXAM: ULTRASOUND OF THE RIGHT BREAST   COMPARISON:  Previous exam(s).   FINDINGS: Targeted ultrasound was performed of the RIGHT upper outer breast. At 10 o'clock 10 cm from the nipple, there is a reniform oval circumscribed mass with a thin smooth cortex and preserved  echogenic hila. It measures approximately 9 mm and is consistent with a benign intramammary lymph node. This corresponds to the site of MRI concern.   IMPRESSION: There is a benign intramammary lymph node at the site of MRI concern in the RIGHT upper outer breast.   RECOMMENDATION: Recommend clinical follow-up with breast surgeon for management of RIGHT nipple asymmetric enhancement and abnormal ductal epithelial touch prep results with consideration of biopsy as clinically warranted.   I have discussed the findings and recommendations with the patient. If applicable, a reminder letter will be sent to the patient regarding the next appointment.   BI-RADS CATEGORY  2: Benign.     Electronically Signed   By: Corean Salter M.D.   On: 11/07/2023 14:56     Assessment and Plan:  Diagnoses and all orders for this visit:   Bloody discharge from right nipple     Since the rest of her workup has been negative, recommend right breast nipple duct excision.  Will excise part of the skin of the central part of her nipple along with the duct that is draining the bloody drainage.  We will use a circumareolar incision for the excision but she will have a small incision in the center of her nipple.The surgical procedure has been discussed with the patient.  Potential risks, benefits, alternative treatments, and expected outcomes have been explained.  All of the patient's questions at this time have been answered.  The likelihood of reaching the patient's treatment goal is good.  The patient understands the proposed surgical procedure and wishes to proceed.     Sherrel Shafer DEWAYNE LIMA, MD  11/11/2023 11:59 AM

## 2023-12-10 ENCOUNTER — Encounter (HOSPITAL_BASED_OUTPATIENT_CLINIC_OR_DEPARTMENT_OTHER): Payer: Self-pay | Admitting: Surgery

## 2023-12-10 ENCOUNTER — Other Ambulatory Visit: Payer: Self-pay

## 2023-12-16 NOTE — Anesthesia Preprocedure Evaluation (Signed)
 Anesthesia Evaluation  Patient identified by MRN, date of birth, ID band Patient awake    Reviewed: Allergy & Precautions, NPO status , Patient's Chart, lab work & pertinent test results  Airway Mallampati: II  TM Distance: >3 FB Neck ROM: Full    Dental no notable dental hx. (+) Dental Advisory Given, Teeth Intact   Pulmonary neg pulmonary ROS   Pulmonary exam normal breath sounds clear to auscultation       Cardiovascular negative cardio ROS Normal cardiovascular exam Rhythm:Regular Rate:Normal     Neuro/Psych negative neurological ROS     GI/Hepatic Neg liver ROS,GERD  Controlled,,  Endo/Other  negative endocrine ROS    Renal/GU Renal disease     Musculoskeletal negative musculoskeletal ROS (+)    Abdominal   Peds  Hematology negative hematology ROS (+)   Anesthesia Other Findings   Reproductive/Obstetrics negative OB ROS                              Anesthesia Physical Anesthesia Plan  ASA: 2  Anesthesia Plan: General   Post-op Pain Management: Tylenol  PO (pre-op)* and Gabapentin  PO (pre-op)*   Induction: Intravenous  PONV Risk Score and Plan: 3 and Ondansetron , Dexamethasone , Treatment may vary due to age or medical condition and Midazolam   Airway Management Planned: LMA  Additional Equipment:   Intra-op Plan:   Post-operative Plan: Extubation in OR  Informed Consent: I have reviewed the patients History and Physical, chart, labs and discussed the procedure including the risks, benefits and alternatives for the proposed anesthesia with the patient or authorized representative who has indicated his/her understanding and acceptance.     Dental advisory given  Plan Discussed with: CRNA  Anesthesia Plan Comments:          Anesthesia Quick Evaluation

## 2023-12-17 ENCOUNTER — Encounter (HOSPITAL_BASED_OUTPATIENT_CLINIC_OR_DEPARTMENT_OTHER)
Admission: RE | Disposition: A | Discharge status: Home / Self Care | Payer: Self-pay | Source: Home / Self Care | Attending: Surgery

## 2023-12-17 ENCOUNTER — Other Ambulatory Visit (HOSPITAL_BASED_OUTPATIENT_CLINIC_OR_DEPARTMENT_OTHER): Payer: Self-pay

## 2023-12-17 ENCOUNTER — Ambulatory Visit (HOSPITAL_BASED_OUTPATIENT_CLINIC_OR_DEPARTMENT_OTHER)
Admission: RE | Admit: 2023-12-17 | Discharge: 2023-12-17 | Disposition: A | Discharge status: Home / Self Care | Attending: Surgery | Admitting: Surgery

## 2023-12-17 ENCOUNTER — Ambulatory Visit (HOSPITAL_BASED_OUTPATIENT_CLINIC_OR_DEPARTMENT_OTHER): Payer: Self-pay | Admitting: Anesthesiology

## 2023-12-17 ENCOUNTER — Other Ambulatory Visit: Payer: Self-pay

## 2023-12-17 ENCOUNTER — Encounter (HOSPITAL_BASED_OUTPATIENT_CLINIC_OR_DEPARTMENT_OTHER): Payer: Self-pay | Admitting: Surgery

## 2023-12-17 DIAGNOSIS — Z01818 Encounter for other preprocedural examination: Secondary | ICD-10-CM

## 2023-12-17 DIAGNOSIS — N6452 Nipple discharge: Secondary | ICD-10-CM

## 2023-12-17 DIAGNOSIS — K5909 Other constipation: Secondary | ICD-10-CM | POA: Insufficient documentation

## 2023-12-17 DIAGNOSIS — K219 Gastro-esophageal reflux disease without esophagitis: Secondary | ICD-10-CM | POA: Insufficient documentation

## 2023-12-17 DIAGNOSIS — N6031 Fibrosclerosis of right breast: Secondary | ICD-10-CM | POA: Insufficient documentation

## 2023-12-17 HISTORY — PX: BREAST CYST EXCISION: SHX579

## 2023-12-17 LAB — POCT PREGNANCY, URINE: Preg Test, Ur: NEGATIVE

## 2023-12-17 SURGERY — EXCISION, CYST, BREAST
Anesthesia: General | Site: Breast | Laterality: Right

## 2023-12-17 MED ORDER — HYDROMORPHONE HCL 1 MG/ML IJ SOLN
INTRAMUSCULAR | Status: AC
  Filled 2023-12-17: qty 0.5

## 2023-12-17 MED ORDER — DEXAMETHASONE SODIUM PHOSPHATE 10 MG/ML IJ SOLN
INTRAMUSCULAR | Status: AC
  Filled 2023-12-17: qty 1

## 2023-12-17 MED ORDER — HYDROMORPHONE HCL 1 MG/ML IJ SOLN
INTRAMUSCULAR | Status: AC
Start: 2023-12-17 — End: 2023-12-17
  Filled 2023-12-17: qty 0.5

## 2023-12-17 MED ORDER — LIDOCAINE 2% (20 MG/ML) 5 ML SYRINGE
INTRAMUSCULAR | Status: DC | PRN
Start: 2023-12-17 — End: 2023-12-17
  Administered 2023-12-17: 60 mg via INTRAVENOUS

## 2023-12-17 MED ORDER — PROPOFOL 10 MG/ML IV BOLUS
INTRAVENOUS | Status: DC | PRN
  Administered 2023-12-17: 50 mg via INTRAVENOUS
  Administered 2023-12-17: 150 mg via INTRAVENOUS

## 2023-12-17 MED ORDER — BUPIVACAINE-EPINEPHRINE (PF) 0.25% -1:200000 IJ SOLN
INTRAMUSCULAR | Status: AC
  Filled 2023-12-17: qty 30

## 2023-12-17 MED ORDER — BUPIVACAINE HCL (PF) 0.25 % IJ SOLN
INTRAMUSCULAR | Status: DC | PRN
  Administered 2023-12-17: 7 mL

## 2023-12-17 MED ORDER — 0.9 % SODIUM CHLORIDE (POUR BTL) OPTIME
TOPICAL | Status: DC | PRN
  Administered 2023-12-17: 120 mL

## 2023-12-17 MED ORDER — OXYCODONE HCL 5 MG PO TABS
5.0000 mg | ORAL_TABLET | Freq: Once | ORAL | Status: AC | PRN
  Administered 2023-12-17: 5 mg via ORAL

## 2023-12-17 MED ORDER — BACITRACIN ZINC 500 UNIT/GM EX OINT
TOPICAL_OINTMENT | CUTANEOUS | Status: AC
  Filled 2023-12-17: qty 28.35

## 2023-12-17 MED ORDER — HYDROCODONE-ACETAMINOPHEN 5-325 MG PO TABS: 1.0000 | ORAL_TABLET | ORAL | 0 refills | Status: DC | PRN

## 2023-12-17 MED ORDER — MIDAZOLAM HCL 2 MG/2ML IJ SOLN
INTRAMUSCULAR | Status: AC
  Filled 2023-12-17: qty 2

## 2023-12-17 MED ORDER — PROPOFOL 500 MG/50ML IV EMUL
INTRAVENOUS | Status: AC
  Filled 2023-12-17: qty 50

## 2023-12-17 MED ORDER — DROPERIDOL 2.5 MG/ML IJ SOLN: 0.6250 mg | Freq: Once | INTRAMUSCULAR | Status: DC | PRN

## 2023-12-17 MED ORDER — GABAPENTIN 300 MG PO CAPS
300.0000 mg | ORAL_CAPSULE | Freq: Once | ORAL | Status: AC
  Administered 2023-12-17: 300 mg via ORAL

## 2023-12-17 MED ORDER — CEFAZOLIN SODIUM-DEXTROSE 2-4 GM/100ML-% IV SOLN
2.0000 g | INTRAVENOUS | Status: AC
  Administered 2023-12-17: 2 g via INTRAVENOUS

## 2023-12-17 MED ORDER — MIDAZOLAM HCL 5 MG/5ML IJ SOLN
INTRAMUSCULAR | Status: DC | PRN
  Administered 2023-12-17: 2 mg via INTRAVENOUS

## 2023-12-17 MED ORDER — GABAPENTIN 300 MG PO CAPS
ORAL_CAPSULE | ORAL | Status: AC
  Filled 2023-12-17: qty 1

## 2023-12-17 MED ORDER — LIDOCAINE-EPINEPHRINE 1 %-1:100000 IJ SOLN
INTRAMUSCULAR | Status: AC
  Filled 2023-12-17: qty 1

## 2023-12-17 MED ORDER — OXYMETAZOLINE HCL 0.05 % NA SOLN
NASAL | Status: AC
  Filled 2023-12-17: qty 30

## 2023-12-17 MED ORDER — DEXAMETHASONE SODIUM PHOSPHATE 4 MG/ML IJ SOLN
INTRAMUSCULAR | Status: DC | PRN
  Administered 2023-12-17: 10 mg via INTRAVENOUS

## 2023-12-17 MED ORDER — OXYCODONE HCL 5 MG PO TABS
ORAL_TABLET | ORAL | Status: AC
  Filled 2023-12-17: qty 1

## 2023-12-17 MED ORDER — FENTANYL CITRATE (PF) 100 MCG/2ML IJ SOLN
INTRAMUSCULAR | Status: DC | PRN
  Administered 2023-12-17 (×2): 50 ug via INTRAVENOUS

## 2023-12-17 MED ORDER — HYDROMORPHONE HCL 1 MG/ML IJ SOLN
0.2500 mg | INTRAMUSCULAR | Status: DC | PRN
  Administered 2023-12-17: 0.5 mg via INTRAVENOUS
  Administered 2023-12-17: 0.25 mg via INTRAVENOUS

## 2023-12-17 MED ORDER — CHLORHEXIDINE GLUCONATE CLOTH 2 % EX PADS: 6.0000 | MEDICATED_PAD | Freq: Once | CUTANEOUS | Status: DC

## 2023-12-17 MED ORDER — ACETAMINOPHEN 500 MG PO TABS: 1000.0000 mg | ORAL_TABLET | Freq: Once | ORAL | Status: DC

## 2023-12-17 MED ORDER — ONDANSETRON HCL 4 MG/2ML IJ SOLN
INTRAMUSCULAR | Status: DC | PRN
  Administered 2023-12-17: 4 mg via INTRAVENOUS

## 2023-12-17 MED ORDER — FENTANYL CITRATE (PF) 100 MCG/2ML IJ SOLN
INTRAMUSCULAR | Status: AC
  Filled 2023-12-17: qty 2

## 2023-12-17 MED ORDER — HYDROCODONE-ACETAMINOPHEN 5-325 MG PO TABS
1.0000 | ORAL_TABLET | ORAL | 0 refills | Status: AC | PRN
  Filled 2023-12-17: qty 20, 4d supply, fill #0

## 2023-12-17 MED ORDER — ONDANSETRON HCL 4 MG/2ML IJ SOLN
INTRAMUSCULAR | Status: AC
  Filled 2023-12-17: qty 2

## 2023-12-17 MED ORDER — ACETAMINOPHEN 500 MG PO TABS
1000.0000 mg | ORAL_TABLET | ORAL | Status: AC
  Administered 2023-12-17: 1000 mg via ORAL

## 2023-12-17 MED ORDER — CEFAZOLIN SODIUM-DEXTROSE 2-4 GM/100ML-% IV SOLN
INTRAVENOUS | Status: AC
  Filled 2023-12-17: qty 100

## 2023-12-17 MED ORDER — LACTATED RINGERS IV SOLN: INTRAVENOUS | Status: DC

## 2023-12-17 MED ORDER — PHENYLEPHRINE HCL (PRESSORS) 10 MG/ML IV SOLN
INTRAVENOUS | Status: DC | PRN
  Administered 2023-12-17: 80 ug via INTRAVENOUS

## 2023-12-17 MED ORDER — BUPIVACAINE HCL (PF) 0.25 % IJ SOLN
INTRAMUSCULAR | Status: AC
  Filled 2023-12-17: qty 30

## 2023-12-17 MED ORDER — OXYCODONE HCL 5 MG/5ML PO SOLN: 5.0000 mg | Freq: Once | ORAL | Status: AC | PRN

## 2023-12-17 MED ORDER — ACETAMINOPHEN 500 MG PO TABS
ORAL_TABLET | ORAL | Status: AC
  Filled 2023-12-17: qty 2

## 2023-12-17 SURGICAL SUPPLY — 39 items
BENZOIN TINCTURE PRP APPL 2/3 (GAUZE/BANDAGES/DRESSINGS) ×1 IMPLANT
BLADE HEX COATED 2.75 (ELECTRODE) ×1 IMPLANT
BLADE SURG 11 STRL SS (BLADE) IMPLANT
BLADE SURG 15 STRL LF DISP TIS (BLADE) ×1 IMPLANT
CANISTER SUCT 1200ML W/VALVE (MISCELLANEOUS) ×1 IMPLANT
CHLORAPREP W/TINT 26 (MISCELLANEOUS) ×1 IMPLANT
CLIP APPLIE 9.375 MED OPEN (MISCELLANEOUS) ×1 IMPLANT
COVER BACK TABLE 60X90IN (DRAPES) ×1 IMPLANT
COVER MAYO STAND STRL (DRAPES) ×1 IMPLANT
COVER SURGICAL LIGHT HANDLE (MISCELLANEOUS) IMPLANT
DERMABOND ADVANCED .7 DNX12 (GAUZE/BANDAGES/DRESSINGS) IMPLANT
DRAPE LAPAROTOMY TRNSV 102X78 (DRAPES) ×1 IMPLANT
DRAPE UTILITY XL STRL (DRAPES) ×1 IMPLANT
DRSG TEGADERM 4X4.75 (GAUZE/BANDAGES/DRESSINGS) ×1 IMPLANT
ELECTRODE REM PT RTRN 9FT ADLT (ELECTROSURGICAL) ×1 IMPLANT
GAUZE SPONGE 4X4 12PLY STRL LF (GAUZE/BANDAGES/DRESSINGS) ×1 IMPLANT
GLOVE BIO SURGEON STRL SZ7 (GLOVE) ×1 IMPLANT
GLOVE BIOGEL PI IND STRL 7.5 (GLOVE) ×1 IMPLANT
GOWN STRL REUS W/ TWL LRG LVL3 (GOWN DISPOSABLE) ×2 IMPLANT
KIT MARKER MARGIN INK (KITS) IMPLANT
NDL HYPO 25X1 1.5 SAFETY (NEEDLE) ×1 IMPLANT
NEEDLE HYPO 25X1 1.5 SAFETY (NEEDLE) ×1 IMPLANT
NS IRRIG 1000ML POUR BTL (IV SOLUTION) ×1 IMPLANT
PACK BASIN DAY SURGERY FS (CUSTOM PROCEDURE TRAY) ×1 IMPLANT
PENCIL SMOKE EVACUATOR (MISCELLANEOUS) ×1 IMPLANT
SLEEVE SCD COMPRESS KNEE MED (STOCKING) ×1 IMPLANT
SPIKE FLUID TRANSFER (MISCELLANEOUS) IMPLANT
SPONGE INTESTINAL PEANUT (DISPOSABLE) IMPLANT
SPONGE T-LAP 4X18 ~~LOC~~+RFID (SPONGE) ×1 IMPLANT
STRIP CLOSURE SKIN 1/2X4 (GAUZE/BANDAGES/DRESSINGS) ×1 IMPLANT
SUT MNCRL AB 4-0 PS2 18 (SUTURE) ×1 IMPLANT
SUT SILK 0 TIES 10X30 (SUTURE) IMPLANT
SUT VIC AB 3-0 SH 27X BRD (SUTURE) ×1 IMPLANT
SYR BULB EAR ULCER 3OZ GRN STR (SYRINGE) IMPLANT
SYR CONTROL 10ML LL (SYRINGE) ×1 IMPLANT
SYRINGE 1CC 27X.5 TB SAFETYGLD (MISCELLANEOUS) IMPLANT
TRAY FAXITRON CT DISP (TRAY / TRAY PROCEDURE) IMPLANT
TUBE CONNECTING 20X1/4 (TUBING) ×1 IMPLANT
YANKAUER SUCT BULB TIP NO VENT (SUCTIONS) ×1 IMPLANT

## 2023-12-17 NOTE — Op Note (Signed)
 Pre-op diagnosis: Persistent right nipple discharge Postop diagnosis: Same Procedure performed: Right nipple duct excision Surgeon:Tenisha Fleece K Kristene Liberati Resident: Dr. Eva Barrier I was personally performed the critical portions of this procedure and immediately available throughout the entire procedure, as documented in my operative note. Anesthesia: General Indications: This is a 38 year old female with retropectoral saline implants who developed some swelling and drainage from the central right nipple.  Workup was negative for any type of breast mass.  However the nipple discharge intermittently persists.  She presents now for nipple duct excision.  Description of procedure: The patient was brought to the operating room placed in the supine position on the operating table.  After adequate level general anesthesia was obtained, patient's right breast was prepped with ChloraPrep and draped in sterile fashion.  A timeout was taken to ensure the proper patient and proper procedure.  I could visualize the duct opening in the central right nipple.  We tried to thread a lacrimal duct probe into this area but we were unsuccessful.  We injected local anesthetic around the areola.  I made a circumareolar incision around the lateral side of the nipple.  We then dissected behind the areola until we reached the central portion of the nipple.  I sharply divided the nipple duct off of the posterior surface of the nipple.  We then took a cone-shaped area of breast tissue with a depth of about 2 cm.  The superficial portion of the lumpectomy is the apex of this colon.  We oriented the specimen with a paint kit.  The skin in the central portion of the nipple appeared mildly ischemic.  We took a small wedge of skin sharply.  This was sent separately as a second specimen.  The skin of the nipple was then closed with 4-0 Monocryl.  We inspected.  Lumpectomy site for hemostasis.  We irrigated thoroughly.  The wound was closed with  deep layer 3-0 Vicryl and a subcuticular layer of 4-0 Monocryl.  Dermabond was applied.  The patient was then extubated brought to recovery in stable condition.  All sponge, instrument, and needle counts are correct.  Donnice POUR. Belinda, MD, [facility]  General Surgery   12/17/2023 8:45 AM

## 2023-12-17 NOTE — Transfer of Care (Signed)
 Immediate Anesthesia Transfer of Care Note  Patient: Barbara Wolfe  Procedure(s) Performed: RIGHT NIPPLE DUCT EXCISION (Right: Breast)  Patient Location: PACU  Anesthesia Type:General  Level of Consciousness: awake, alert , and oriented  Airway & Oxygen Therapy: Patient Spontanous Breathing and Patient connected to face mask oxygen  Post-op Assessment: Report given to RN and Post -op Vital signs reviewed and stable  Post vital signs: Reviewed and stable  Last Vitals:  Vitals Value Taken Time  BP 116/72 12/17/23 08:46  Temp 36.2 C 12/17/23 08:45  Pulse 86 12/17/23 08:50  Resp 18 12/17/23 08:50  SpO2 100 % 12/17/23 08:50  Vitals shown include unfiled device data.  Last Pain:  Vitals:   12/17/23 0634  TempSrc: Temporal  PainSc: 0-No pain      Patients Stated Pain Goal: 3 (12/17/23 9365)  Complications: No notable events documented.

## 2023-12-17 NOTE — Anesthesia Postprocedure Evaluation (Signed)
 Anesthesia Post Note  Patient: Barbara Wolfe  Procedure(s) Performed: RIGHT NIPPLE DUCT EXCISION (Right: Breast)     Patient location during evaluation: PACU Anesthesia Type: General Level of consciousness: sedated and patient cooperative Pain management: pain level controlled Vital Signs Assessment: post-procedure vital signs reviewed and stable Respiratory status: spontaneous breathing Cardiovascular status: stable Anesthetic complications: no   No notable events documented.  Last Vitals:  Vitals:   12/17/23 0915 12/17/23 0928  BP: 111/79 (!) 120/91  Pulse: 71 94  Resp: 17 16  Temp:  36.5 C  SpO2: 96% 93%    Last Pain:  Vitals:   12/17/23 0928  TempSrc: Temporal  PainSc: 4                  Norleen Pope

## 2023-12-17 NOTE — Discharge Instructions (Addendum)
 [facility] [employer] [facility] Office Phone Number [phone]  BREAST BIOPSY/ PARTIAL MASTECTOMY: POST OP INSTRUCTIONS  Always review your discharge instruction sheet given to you by the facility where your surgery was performed.  IF YOU HAVE DISABILITY OR FAMILY LEAVE FORMS, YOU MUST BRING THEM TO THE OFFICE FOR PROCESSING.  DO NOT GIVE THEM TO YOUR DOCTOR.  A prescription for pain medication may be given to you upon discharge.  Take your pain medication as prescribed, if needed.  If narcotic pain medicine is not needed, then you may take acetaminophen  (Tylenol ) or ibuprofen  (Advil ) as needed. Take your usually prescribed medications unless otherwise directed If you need a refill on your pain medication, please contact your pharmacy.  They will contact our office to request authorization.  Prescriptions will not be filled after 5pm or on week-ends. You should eat very light the first 24 hours after surgery, such as soup, crackers, pudding, etc.  Resume your normal diet the day after surgery. Most patients will experience some swelling and bruising in the breast.  Ice packs and a good support bra will help.  Swelling and bruising can take several days to resolve.  It is common to experience some constipation if taking pain medication after surgery.  Increasing fluid intake and taking a stool softener will usually help or prevent this problem from occurring.  A mild laxative (Milk of Magnesia or Miralax) should be taken according to package directions if there are no bowel movements after 48 hours. Unless discharge instructions indicate otherwise, you may remove your bandages 24-48 hours after surgery, and you may shower at that time.  You may have steri-strips (small skin tapes) in place directly over the incision.  These strips should be left on the skin for 7-10 days.  If your surgeon used skin glue on the incision, you may shower in 24 hours.  The glue will flake off over the next 2-3 weeks.  Any  sutures or staples will be removed at the office during your follow-up visit. ACTIVITIES:  You may resume regular daily activities (gradually increasing) beginning the next day.  Wearing a good support bra or sports bra minimizes pain and swelling.  You may have sexual intercourse when it is comfortable. You may drive when you no longer are taking prescription pain medication, you can comfortably wear a seatbelt, and you can safely maneuver your car and apply brakes. RETURN TO WORK:  ______________________________________________________________________________________ Barbara Wolfe should see your doctor in the office for a follow-up appointment approximately two weeks after your surgery.  Your doctor's nurse will typically make your follow-up appointment when she calls you with your pathology report.  Expect your pathology report 2-3 business days after your surgery.  You may call to check if you do not hear from us  after three days. OTHER INSTRUCTIONS: _______________________________________________________________________________________________ _____________________________________________________________________________________________________________________________________ _____________________________________________________________________________________________________________________________________ _____________________________________________________________________________________________________________________________________  WHEN TO CALL YOUR DOCTOR: Fever over 101.0 Nausea and/or vomiting. Extreme swelling or bruising. Continued bleeding from incision. Increased pain, redness, or drainage from the incision.  The clinic staff is available to answer your questions during regular business hours.  Please don't hesitate to call and ask to speak to one of the nurses for clinical concerns.  If you have a medical emergency, go to the nearest emergency room or call 911.  A surgeon from [facility] is always on call at the hospital.  For further questions, please visit [email or web address]     Post Anesthesia Home Care Instructions  Activity: Get plenty of rest for the remainder  of the day. A responsible individual must stay with you for 24 hours following the procedure.  For the next 24 hours, DO NOT: -Drive a car -Advertising copywriter -Drink alcoholic beverages -Take any medication unless instructed by your physician -Make any legal decisions or sign important papers.  Meals: Start with liquid foods such as gelatin or soup. Progress to regular foods as tolerated. Avoid greasy, spicy, heavy foods. If nausea and/or vomiting occur, drink only clear liquids until the nausea and/or vomiting subsides. Call your physician if vomiting continues.  Special Instructions/Symptoms: Your throat may feel dry or sore from the anesthesia or the breathing tube placed in your throat during surgery. If this causes discomfort, gargle with warm salt water. The discomfort should disappear within 24 hours.  If you had a scopolamine patch placed behind your ear for the management of post- operative nausea and/or vomiting:  1. The medication in the patch is effective for 72 hours, after which it should be removed.  Wrap patch in a tissue and discard in the trash. Wash hands thoroughly with soap and water. 2. You may remove the patch earlier than 72 hours if you experience unpleasant side effects which may include dry mouth, dizziness or visual disturbances. 3. Avoid touching the patch. Wash your hands with soap and water after contact with the patch.  No tylenol  until after 12:30 today. Oxycodone  taken at 9:30 am

## 2023-12-17 NOTE — H&P (Signed)
 Subjective    Chief Complaint: Follow-up (Follow up  rt breast discharge wi/ atypical ductal epithelial cells present )       History of Present Illness: Barbara Wolfe is a 38 y.o. female who is seen today as an office consultation at the request of Dr. Alger for evaluation of Follow-up (Follow up  rt breast discharge wi/ atypical ductal epithelial cells present ) .   This is a 38 year old female in good health who presents with some right nipple discharge.  The patient underwent bilateral breast augmentation in 2007.  Surgery was performed in [place].  She states that she has saline implants in place.  About 8 weeks ago, the patient developed redness, tenderness, and significant swelling in the central right breast.  She developed some drainage from the right nipple.  Mostly, this was clear but she did notice some blood on at least 1 occasion.  She was evaluated with mammogram and ultrasound that were unremarkable.  Cytology of the nipple discharge shows some atypical ductal epithelial cells.  She was started on antibiotics.  The redness, tenderness, and swelling have improved but the nipple discharge continues.   She was initially seen on 09/24/2023.  MRI performed on 10/27/2023 revealed some asymmetric enhancement of the right nipple.  The right upper outer quadrant showed a 1 cm indeterminant breast mass.  She underwent second look ultrasound of the right breast mass and this appears to be an intramammary lymph node that appears benign.  The patient continues to have some nipple discharge.  This always comes from the center of her nipple.       Medical History:     Problem List       Patient Active Problem List  Diagnosis   Breast infection in female   GERD (gastroesophageal reflux disease)   Primary fibromyalgia syndrome        Past Surgical History           Past Surgical History:  Procedure Laterality Date   Breast Augmentation Surgery   2007        Allergies          Allergies  Allergen Reactions   Venom-Honey Bee Anaphylaxis        Medications Ordered Prior to Encounter             Current Outpatient Medications on File Prior to Visit  Medication Sig Dispense Refill   dextroamphetamine-amphetamine (ADDERALL XR) 20 MG XR capsule TAKE 1 CAPSULE BY MOUTH DAILY IN THE MORNING       dextroamphetamine-amphetamine (ADDERALL XR) 5 MG XR capsule Take 5 mg by mouth once daily as needed       LORazepam (ATIVAN) 1 MG tablet TAKE 1 TABLET BY MOUTH DAILY AT BEDTIME AS NEEDED        No current facility-administered medications on file prior to visit.        Family History  History reviewed. No pertinent family history.      Tobacco Use History  Social History         Tobacco Use  Smoking Status Never  Smokeless Tobacco Never        Social History  Social History             Socioeconomic History   Marital status: Married  Tobacco Use   Smoking status: Never   Smokeless tobacco: Never  Substance and Sexual Activity   Alcohol use: Yes      Comment: Socially   Drug use:  Never    Social Drivers of Health           Food Insecurity: No Food Insecurity (09/12/2023)    Received from [facility]    Hunger Vital Sign     Within the past 12 months, you worried that your food would run out before you got the money to buy more.: Never true     Within the past 12 months, the food you bought just didn't last and you didn't have money to get more.: Never true  Transportation Needs: No Transportation Needs (09/12/2023)    Received from [facility] - [facility]     Lack of Transportation (Medical): No     Lack of Transportation (Non-Medical): No  Housing Stability: Unknown (09/24/2023)    Housing Stability Vital Sign     Homeless in the Last Year: No        Objective:           Vitals:    11/11/23 1041  Temp: 36.8 C (98.3 F)  PainSc: 0-No pain    There is no height or weight on file to calculate BMI.   Physical Exam     Constitutional:  WDWN in NAD, conversant, no obvious deformities; lying in bed comfortably Eyes:  Pupils equal, round; sclera anicteric; moist conjunctiva; no lid lag HENT:  Oral mucosa moist; good dentition  Neck:  No masses palpated, trachea midline; no thyromegaly Lungs:  CTA bilaterally; normal respiratory effort Breasts: symmetric, no nipple changes on left; right nipple shows some central crusting and mild tenderness with some visible drainage from the center of the nipple.  No palpable masses or lymphadenopathy on either side  CV:  Regular rate and rhythm; no murmurs; extremities well-perfused with no edema Abd:  +bowel sounds, soft, non-tender, no palpable organomegaly; no palpable hernias Musc: Normal gait; no apparent clubbing or cyanosis in extremities Lymphatic:  No palpable cervical or axillary lymphadenopathy Skin:  Warm, dry; no sign of jaundice Psychiatric - alert and oriented x 4; calm mood and affect     Labs, Imaging and Diagnostic Testing: CLINICAL DATA:  38 year old female presents with RIGHT nipple discharge and RIGHT nipple scabbing/crusting. Patient with bilateral breast implants.   EXAM: BILATERAL BREAST MRI WITH AND WITHOUT CONTRAST   TECHNIQUE: Multiplanar, multisequence MR images of both breasts were obtained prior to and following the intravenous administration of 7 ml of Vueway    Three-dimensional MR images were rendered by post-processing of the original MR data on an independent workstation. The three-dimensional MR images were interpreted, and findings are reported in the following complete MRI report for this study. Three dimensional images were evaluated at the independent interpreting workstation using the DynaCAD thin client.   COMPARISON:  09/12/2023 mammogram, ultrasound and prior studies   FINDINGS: Breast composition: c. Heterogeneous fibroglandular tissue.   Background parenchymal enhancement: Mild   Right breast: There is  asymmetric enhancement of the anterior aspect of the RIGHT nipple (image 79: Series 7).   A 1 cm enhancing mass within the posterior UPPER-OUTER RIGHT breast (58:7) may represent a lymph node but cannot be characterized definitely on this exam.   No other suspicious abnormalities noted within the RIGHT breast. A retropectoral implant is present.   Left breast: No suspicious mass or worrisome enhancement. A retropectoral implant is present.   Lymph nodes: No abnormal appearing lymph nodes.   Ancillary findings:  None.   IMPRESSION: 1. Asymmetric enhancement of the anterior RIGHT nipple. Given physical exam  demonstrating scabbing/crusting and nipple discharge demonstrating atypical cells, nipple biopsy is recommended. 2. Indeterminate 1 cm posterior UPPER-OUTER RIGHT breast mass. 2nd-look ultrasound is recommended. 3. No other suspicious abnormalities within either breast. No abnormal appearing lymph nodes.   RECOMMENDATION: RIGHT nipple biopsy.   2nd-look RIGHT breast ultrasound.   BI-RADS CATEGORY  4: Suspicious.     Electronically Signed   By: Reyes Phi M.D.   On: 10/28/2023 12:23   CLINICAL DATA:  Patient with history of RIGHT nipple irritation and discharge. Touch prep of the RIGHT nipple demonstrated atypical ductal epithelial cells in May 2025. Recent MRI demonstrated asymmetric enhancement of the RIGHT nipple (for which surgical consultation was recommended) as well as a indeterminate mass in the RIGHT upper outer quadrant which was favored to reflect a lymph node but could not be definitively characterized as such. Patient presents for evaluation of this RIGHT upper outer quadrant mass.   EXAM: ULTRASOUND OF THE RIGHT BREAST   COMPARISON:  Previous exam(s).   FINDINGS: Targeted ultrasound was performed of the RIGHT upper outer breast. At 10 o'clock 10 cm from the nipple, there is a reniform oval circumscribed mass with a thin smooth cortex and preserved  echogenic hila. It measures approximately 9 mm and is consistent with a benign intramammary lymph node. This corresponds to the site of MRI concern.   IMPRESSION: There is a benign intramammary lymph node at the site of MRI concern in the RIGHT upper outer breast.   RECOMMENDATION: Recommend clinical follow-up with breast surgeon for management of RIGHT nipple asymmetric enhancement and abnormal ductal epithelial touch prep results with consideration of biopsy as clinically warranted.   I have discussed the findings and recommendations with the patient. If applicable, a reminder letter will be sent to the patient regarding the next appointment.   BI-RADS CATEGORY  2: Benign.     Electronically Signed   By: Corean Salter M.D.   On: 11/07/2023 14:56     Assessment and Plan:  Diagnoses and all orders for this visit:   Bloody discharge from right nipple     Since the rest of her workup has been negative, recommend right breast nipple duct excision.  Will excise part of the skin of the central part of her nipple along with the duct that is draining the bloody drainage.  We will use a circumareolar incision for the excision but she will have a small incision in the center of her nipple.The surgical procedure has been discussed with the patient.  Potential risks, benefits, alternative treatments, and expected outcomes have been explained.  All of the patient's questions at this time have been answered.  The likelihood of reaching the patient's treatment goal is good.  The patient understands the proposed surgical procedure and wishes to proceed.   Donnice POUR. Belinda, MD, [facility]  General Surgery   12/17/2023 7:17 AM

## 2023-12-17 NOTE — Anesthesia Procedure Notes (Signed)
 Procedure Name: LMA Insertion Date/Time: 12/17/2023 7:40 AM  Performed by: Julieanne Fairy BROCKS, CRNAPre-anesthesia Checklist: Patient identified, Emergency Drugs available, Suction available and Patient being monitored Patient Re-evaluated:Patient Re-evaluated prior to induction Oxygen Delivery Method: Circle system utilized Preoxygenation: Pre-oxygenation with 100% oxygen Induction Type: IV induction Ventilation: Mask ventilation without difficulty LMA: LMA inserted LMA Size: 4.0 Number of attempts: 1 Airway Equipment and Method: Bite block Placement Confirmation: positive ETCO2 Tube secured with: Tape Dental Injury: Teeth and Oropharynx as per pre-operative assessment

## 2023-12-18 ENCOUNTER — Encounter (HOSPITAL_BASED_OUTPATIENT_CLINIC_OR_DEPARTMENT_OTHER): Payer: Self-pay | Admitting: Surgery

## 2023-12-19 ENCOUNTER — Ambulatory Visit: Payer: Self-pay | Admitting: Surgery

## 2023-12-19 LAB — SURGICAL PATHOLOGY
